# Patient Record
Sex: Male | Born: 1950 | Race: Black or African American | Hispanic: No | Marital: Single | Smoking: Never smoker
Health system: Southern US, Community
[De-identification: ages and names within clinical notes are randomized; demographics above are authoritative.]

## PROBLEM LIST (undated history)

## (undated) ENCOUNTER — Emergency Department (HOSPITAL_COMMUNITY): Admission: EM | Payer: Self-pay | Source: Home / Self Care

## (undated) DIAGNOSIS — H3320 Serous retinal detachment, unspecified eye: Secondary | ICD-10-CM

## (undated) HISTORY — PX: NO PAST SURGERIES: SHX2092

---

## 2016-10-22 DIAGNOSIS — H4313 Vitreous hemorrhage, bilateral: Secondary | ICD-10-CM | POA: Diagnosis not present

## 2016-10-22 DIAGNOSIS — H25813 Combined forms of age-related cataract, bilateral: Secondary | ICD-10-CM | POA: Diagnosis not present

## 2016-10-22 DIAGNOSIS — E113511 Type 2 diabetes mellitus with proliferative diabetic retinopathy with macular edema, right eye: Secondary | ICD-10-CM | POA: Diagnosis not present

## 2016-10-22 DIAGNOSIS — H35372 Puckering of macula, left eye: Secondary | ICD-10-CM | POA: Diagnosis not present

## 2016-10-22 DIAGNOSIS — E113522 Type 2 diabetes mellitus with proliferative diabetic retinopathy with traction retinal detachment involving the macula, left eye: Secondary | ICD-10-CM | POA: Diagnosis not present

## 2016-10-22 DIAGNOSIS — H3562 Retinal hemorrhage, left eye: Secondary | ICD-10-CM | POA: Diagnosis not present

## 2016-11-12 DIAGNOSIS — E113511 Type 2 diabetes mellitus with proliferative diabetic retinopathy with macular edema, right eye: Secondary | ICD-10-CM | POA: Diagnosis not present

## 2016-11-12 DIAGNOSIS — E113522 Type 2 diabetes mellitus with proliferative diabetic retinopathy with traction retinal detachment involving the macula, left eye: Secondary | ICD-10-CM | POA: Diagnosis not present

## 2016-12-15 ENCOUNTER — Encounter (HOSPITAL_COMMUNITY): Payer: Self-pay | Admitting: *Deleted

## 2016-12-15 ENCOUNTER — Ambulatory Visit: Payer: Self-pay | Admitting: Ophthalmology

## 2016-12-15 NOTE — Progress Notes (Signed)
Pt denies SOB, chest pain, and being under the care of a cardiologist. Pt denies having a stress test, echo and cardiac cath. Pt denies having an EKG and chest x ray within the last year. Pt denies having recent labs. Pt made aware to stop taking vitamins, fish oil and herbal medications. Do not take any NSAIDs ie: Ibuprofen, Advil, Naproxen (Aleve), Motrin, BC and Goody Powder or any medication containing Aspirin. Pt verbalized under standing of all pre-op instructions.

## 2016-12-15 NOTE — H&P (Signed)
  Date of examination:  11/12/16  Indication for surgery: PDR and TRD left eye  Pertinent past medical history:  Past Medical History:  Diagnosis Date  . Retinal detachment    left eye    Pertinent ocular history:  Proliferative diabetic retinopathy (PDR)  Pertinent family history: No family history on file.  General:  Healthy appearing patient in no distress.    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

## 2016-12-16 ENCOUNTER — Ambulatory Visit (HOSPITAL_COMMUNITY): Payer: Medicare Other | Admitting: Anesthesiology

## 2016-12-16 ENCOUNTER — Ambulatory Visit (HOSPITAL_COMMUNITY)
Admission: RE | Admit: 2016-12-16 | Discharge: 2016-12-16 | Disposition: A | Discharge status: Home / Self Care | Payer: Medicare Other | Source: Ambulatory Visit | Attending: Ophthalmology | Admitting: Ophthalmology

## 2016-12-16 ENCOUNTER — Encounter (HOSPITAL_COMMUNITY)
Admission: RE | Disposition: A | Discharge status: Home / Self Care | Payer: Self-pay | Source: Ambulatory Visit | Attending: Ophthalmology

## 2016-12-16 ENCOUNTER — Encounter (HOSPITAL_COMMUNITY): Payer: Self-pay | Admitting: *Deleted

## 2016-12-16 DIAGNOSIS — E113591 Type 2 diabetes mellitus with proliferative diabetic retinopathy without macular edema, right eye: Secondary | ICD-10-CM | POA: Diagnosis not present

## 2016-12-16 DIAGNOSIS — E113532 Type 2 diabetes mellitus with proliferative diabetic retinopathy with traction retinal detachment not involving the macula, left eye: Secondary | ICD-10-CM | POA: Diagnosis not present

## 2016-12-16 DIAGNOSIS — H3342 Traction detachment of retina, left eye: Secondary | ICD-10-CM | POA: Diagnosis not present

## 2016-12-16 DIAGNOSIS — E113522 Type 2 diabetes mellitus with proliferative diabetic retinopathy with traction retinal detachment involving the macula, left eye: Secondary | ICD-10-CM | POA: Diagnosis not present

## 2016-12-16 HISTORY — PX: GAS/FLUID EXCHANGE: SHX5334

## 2016-12-16 HISTORY — PX: LASER PHOTO ABLATION: SHX5942

## 2016-12-16 HISTORY — DX: Serous retinal detachment, unspecified eye: H33.20

## 2016-12-16 HISTORY — PX: REPAIR OF COMPLEX TRACTION RETINAL DETACHMENT: SHX6217

## 2016-12-16 LAB — CBC
HEMATOCRIT: 42.1 % (ref 39.0–52.0)
HEMOGLOBIN: 14.6 g/dL (ref 13.0–17.0)
MCH: 28.7 pg (ref 26.0–34.0)
MCHC: 34.7 g/dL (ref 30.0–36.0)
MCV: 82.9 fL (ref 78.0–100.0)
PLATELETS: 264 10*3/uL (ref 150–400)
RBC: 5.08 MIL/uL (ref 4.22–5.81)
RDW: 12.7 % (ref 11.5–15.5)
WBC: 7.8 10*3/uL (ref 4.0–10.5)

## 2016-12-16 LAB — BASIC METABOLIC PANEL
ANION GAP: 14 (ref 5–15)
BUN: 23 mg/dL — AB (ref 6–20)
CALCIUM: 9.7 mg/dL (ref 8.9–10.3)
CO2: 25 mmol/L (ref 22–32)
CREATININE: 1.18 mg/dL (ref 0.61–1.24)
Chloride: 97 mmol/L — ABNORMAL LOW (ref 101–111)
GFR calc Af Amer: >60 mL/min (ref >60)
GLUCOSE: 197 mg/dL — AB (ref 65–99)
Potassium: 3.6 mmol/L (ref 3.5–5.1)
Sodium: 136 mmol/L (ref 135–145)

## 2016-12-16 SURGERY — REPAIR, RETINAL DETACHMENT, COMPLEX
Anesthesia: General | Site: Eye | Laterality: Left

## 2016-12-16 MED ORDER — CEFAZOLIN SUBCONJUNCTIVAL INJECTION 100 MG/0.5 ML
200.0000 mg | INJECTION | SUBCONJUNCTIVAL | Status: DC
  Filled 2016-12-16: qty 5

## 2016-12-16 MED ORDER — ONDANSETRON HCL 4 MG/2ML IJ SOLN
INTRAMUSCULAR | Status: AC
  Filled 2016-12-16: qty 2

## 2016-12-16 MED ORDER — PHENYLEPHRINE HCL 2.5 % OP SOLN
1.0000 [drp] | OPHTHALMIC | Status: AC | PRN
  Administered 2016-12-16 (×3): 1 [drp] via OPHTHALMIC
  Filled 2016-12-16: qty 2

## 2016-12-16 MED ORDER — CEFTAZIDIME INTRAVITREAL INJECTION 2.25 MG/0.1 ML
2.2500 mg | INTRAVITREAL | Status: DC
  Filled 2016-12-16: qty 0.1

## 2016-12-16 MED ORDER — DEXAMETHASONE SODIUM PHOSPHATE 10 MG/ML IJ SOLN
INTRAMUSCULAR | Status: DC | PRN
  Administered 2016-12-16: 10 mg

## 2016-12-16 MED ORDER — LIDOCAINE 2% (20 MG/ML) 5 ML SYRINGE
INTRAMUSCULAR | Status: AC
  Filled 2016-12-16: qty 10

## 2016-12-16 MED ORDER — LIDOCAINE HCL 2 % IJ SOLN
INTRAMUSCULAR | Status: AC
  Filled 2016-12-16: qty 20

## 2016-12-16 MED ORDER — HYPROMELLOSE (GONIOSCOPIC) 2.5 % OP SOLN
OPHTHALMIC | Status: AC
  Filled 2016-12-16: qty 15

## 2016-12-16 MED ORDER — BSS IO SOLN
INTRAOCULAR | Status: AC
  Filled 2016-12-16: qty 15

## 2016-12-16 MED ORDER — TOBRAMYCIN-DEXAMETHASONE 0.3-0.1 % OP OINT
TOPICAL_OINTMENT | OPHTHALMIC | Status: DC | PRN
  Administered 2016-12-16: 1 via OPHTHALMIC

## 2016-12-16 MED ORDER — ATROPINE SULFATE 1 % OP SOLN
OPHTHALMIC | Status: AC
  Filled 2016-12-16: qty 5

## 2016-12-16 MED ORDER — VANCOMYCIN INTRAVITREAL INJECTION 1 MG/0.1 ML
1.0000 mg | INTRAOCULAR | Status: DC
  Filled 2016-12-16: qty 0.1

## 2016-12-16 MED ORDER — SODIUM CHLORIDE 0.9 % IV SOLN
INTRAVENOUS | Status: DC
  Administered 2016-12-16 (×2): via INTRAVENOUS

## 2016-12-16 MED ORDER — TRIAMCINOLONE ACETONIDE 40 MG/ML IJ SUSP
INTRAMUSCULAR | Status: DC | PRN
  Administered 2016-12-16: 40 mg via INTRAMUSCULAR

## 2016-12-16 MED ORDER — ONDANSETRON HCL 4 MG/2ML IJ SOLN
INTRAMUSCULAR | Status: DC | PRN
  Administered 2016-12-16: 4 mg via INTRAVENOUS

## 2016-12-16 MED ORDER — TETRACAINE HCL 1 % IJ SOLN
INTRAMUSCULAR | Status: AC
  Filled 2016-12-16: qty 2

## 2016-12-16 MED ORDER — HYPROMELLOSE (GONIOSCOPIC) 2.5 % OP SOLN
OPHTHALMIC | Status: DC | PRN
  Administered 2016-12-16: 1 [drp]

## 2016-12-16 MED ORDER — TRIAMCINOLONE ACETONIDE 40 MG/ML IJ SUSP
INTRAMUSCULAR | Status: AC
  Filled 2016-12-16: qty 5

## 2016-12-16 MED ORDER — BUPIVACAINE HCL (PF) 0.75 % IJ SOLN
INTRAMUSCULAR | Status: AC
  Filled 2016-12-16: qty 10

## 2016-12-16 MED ORDER — ONDANSETRON HCL 4 MG/2ML IJ SOLN: 4.0000 mg | Freq: Once | INTRAMUSCULAR | Status: DC | PRN

## 2016-12-16 MED ORDER — PROPOFOL 10 MG/ML IV BOLUS
INTRAVENOUS | Status: AC
  Filled 2016-12-16: qty 20

## 2016-12-16 MED ORDER — DEXAMETHASONE SODIUM PHOSPHATE 10 MG/ML IJ SOLN
INTRAMUSCULAR | Status: AC
  Filled 2016-12-16: qty 1

## 2016-12-16 MED ORDER — TETRACAINE HCL 0.5 % OP SOLN
OPHTHALMIC | Status: AC
  Filled 2016-12-16: qty 4

## 2016-12-16 MED ORDER — HYALURONIDASE HUMAN 150 UNIT/ML IJ SOLN
INTRAMUSCULAR | Status: AC
  Filled 2016-12-16: qty 1

## 2016-12-16 MED ORDER — FENTANYL CITRATE (PF) 100 MCG/2ML IJ SOLN
INTRAMUSCULAR | Status: DC | PRN
  Administered 2016-12-16 (×2): 50 ug via INTRAVENOUS

## 2016-12-16 MED ORDER — BSS PLUS IO SOLN
INTRAOCULAR | Status: AC
  Filled 2016-12-16: qty 500

## 2016-12-16 MED ORDER — PROPARACAINE HCL 0.5 % OP SOLN
1.0000 [drp] | OPHTHALMIC | Status: AC | PRN
  Administered 2016-12-16 (×3): 1 [drp] via OPHTHALMIC
  Filled 2016-12-16: qty 15

## 2016-12-16 MED ORDER — PROPOFOL 10 MG/ML IV BOLUS
INTRAVENOUS | Status: DC | PRN
  Administered 2016-12-16: 75 mg via INTRAVENOUS

## 2016-12-16 MED ORDER — LIDOCAINE HCL 2 % IJ SOLN
INTRAMUSCULAR | Status: DC | PRN
  Administered 2016-12-16: 5 mL via RETROBULBAR

## 2016-12-16 MED ORDER — 0.9 % SODIUM CHLORIDE (POUR BTL) OPTIME
TOPICAL | Status: DC | PRN
  Administered 2016-12-16: 200 mL

## 2016-12-16 MED ORDER — FENTANYL CITRATE (PF) 250 MCG/5ML IJ SOLN
INTRAMUSCULAR | Status: AC
  Filled 2016-12-16: qty 5

## 2016-12-16 MED ORDER — SODIUM CHLORIDE 0.9 % IV SOLN
INTRAVENOUS | Status: DC | PRN
  Administered 2016-12-16: 13:00:00

## 2016-12-16 MED ORDER — FENTANYL CITRATE (PF) 100 MCG/2ML IJ SOLN: 25.0000 ug | INTRAMUSCULAR | Status: DC | PRN

## 2016-12-16 MED ORDER — OFLOXACIN 0.3 % OP SOLN
1.0000 [drp] | OPHTHALMIC | Status: AC | PRN
  Administered 2016-12-16 (×3): 1 [drp] via OPHTHALMIC
  Filled 2016-12-16: qty 5

## 2016-12-16 MED ORDER — STERILE WATER FOR IRRIGATION IR SOLN
Status: DC | PRN
  Administered 2016-12-16: 200 mL

## 2016-12-16 MED ORDER — EPINEPHRINE PF 1 MG/ML IJ SOLN
INTRAMUSCULAR | Status: AC
  Filled 2016-12-16: qty 1

## 2016-12-16 MED ORDER — STERILE WATER FOR INJECTION IJ SOLN
INTRAMUSCULAR | Status: AC
  Filled 2016-12-16: qty 10

## 2016-12-16 MED ORDER — CYCLOPENTOLATE HCL 1 % OP SOLN
1.0000 [drp] | OPHTHALMIC | Status: AC | PRN
  Administered 2016-12-16 (×3): 1 [drp] via OPHTHALMIC
  Filled 2016-12-16: qty 2

## 2016-12-16 MED ORDER — MEPERIDINE HCL 25 MG/ML IJ SOLN: 6.2500 mg | INTRAMUSCULAR | Status: DC | PRN

## 2016-12-16 MED ORDER — EPINEPHRINE PF 1 MG/ML IJ SOLN
INTRAOCULAR | Status: DC | PRN
  Administered 2016-12-16: 12:00:00

## 2016-12-16 MED ORDER — TOBRAMYCIN-DEXAMETHASONE 0.3-0.1 % OP OINT
TOPICAL_OINTMENT | OPHTHALMIC | Status: AC
  Filled 2016-12-16: qty 3.5

## 2016-12-16 MED ORDER — PHENYLEPHRINE HCL 10 % OP SOLN
OPHTHALMIC | Status: DC | PRN
  Administered 2016-12-16: 1 [drp] via OPHTHALMIC

## 2016-12-16 MED ORDER — SUGAMMADEX SODIUM 200 MG/2ML IV SOLN
INTRAVENOUS | Status: AC
  Filled 2016-12-16: qty 2

## 2016-12-16 MED ORDER — PHENYLEPHRINE HCL 10 % OP SOLN
1.0000 [drp] | OPHTHALMIC | Status: DC
  Filled 2016-12-16: qty 5

## 2016-12-16 MED ORDER — TROPICAMIDE 1 % OP SOLN
1.0000 [drp] | OPHTHALMIC | Status: AC | PRN
  Administered 2016-12-16 (×3): 1 [drp] via OPHTHALMIC
  Filled 2016-12-16: qty 15

## 2016-12-16 SURGICAL SUPPLY — 77 items
APPLICATOR COTTON TIP 6IN STRL (MISCELLANEOUS) ×4 IMPLANT
APPLICATOR DR MATTHEWS STRL (MISCELLANEOUS) ×4 IMPLANT
BLADE MINI 60D BLUE (BLADE) IMPLANT
BLADE MINI RND TIP GREEN BEAV (BLADE) IMPLANT
BLADE MVR KNIFE 20G (BLADE) IMPLANT
CANNULA ANT CHAM MAIN (OPHTHALMIC RELATED) IMPLANT
CANNULA DUAL BORE 23G (CANNULA) IMPLANT
CANNULA DUALBORE 25G (CANNULA) IMPLANT
CANNULA VLV SOFT TIP 25GA (OPHTHALMIC) ×4 IMPLANT
CAUTERY EYE LOW TEMP 1300F FIN (OPHTHALMIC RELATED) IMPLANT
CLOSURE STERI-STRIP 1/2X4 (GAUZE/BANDAGES/DRESSINGS) ×1
CLSR STERI-STRIP ANTIMIC 1/2X4 (GAUZE/BANDAGES/DRESSINGS) ×3 IMPLANT
CORD BIPOLAR FORCEPS 12FT (ELECTRODE) ×4 IMPLANT
CORDS BIPOLAR (ELECTRODE) ×4 IMPLANT
COVER MAYO STAND STRL (DRAPES) IMPLANT
COVER SURGICAL LIGHT HANDLE (MISCELLANEOUS) IMPLANT
DRAPE HALF SHEET 40X57 (DRAPES) ×4 IMPLANT
DRAPE INCISE 51X51 W/FILM STRL (DRAPES) IMPLANT
DRAPE RETRACTOR (MISCELLANEOUS) ×4 IMPLANT
ERASER HMR WETFIELD 23G BP (MISCELLANEOUS) IMPLANT
FILTER BLUE MILLIPORE (MISCELLANEOUS) IMPLANT
FILTER STRAW FLUID ASPIR (MISCELLANEOUS) IMPLANT
FORCEPS ECKARDT ILM 25G SERR (OPHTHALMIC RELATED) IMPLANT
FORCEPS GRIESHABER ILM 25G A (INSTRUMENTS) ×4 IMPLANT
GAS AUTO FILL CONSTEL (OPHTHALMIC) ×4
GAS AUTO FILL CONSTELLATION (OPHTHALMIC) ×2 IMPLANT
GAS IO SF6 125GM ISPAN CNSTL (MEDICAL GASES) ×2 IMPLANT
GAS OPHTHALMIC (MISCELLANEOUS) IMPLANT
GAS TANK CONSTELL (MEDICAL GASES) ×2
GLOVE ECLIPSE 7.5 STRL STRAW (GLOVE) ×8 IMPLANT
GLOVE SURG SS PI 6.5 STRL IVOR (GLOVE) ×8 IMPLANT
GOWN STRL REUS W/ TWL LRG LVL3 (GOWN DISPOSABLE) ×6 IMPLANT
GOWN STRL REUS W/TWL LRG LVL3 (GOWN DISPOSABLE) ×6
HANDLE PNEUMATIC FOR CONSTEL (OPHTHALMIC) IMPLANT
KIT BASIN OR (CUSTOM PROCEDURE TRAY) ×4 IMPLANT
KIT PERFLUORON PROCEDURE 5ML (MISCELLANEOUS) IMPLANT
KIT ROOM TURNOVER OR (KITS) ×4 IMPLANT
LENS BIOM SUPER VIEW SET DISP (OPHTHALMIC RELATED) ×4 IMPLANT
MICROPICK 25G (MISCELLANEOUS)
NEEDLE 25GX 5/8IN NON SAFETY (NEEDLE) ×4 IMPLANT
NEEDLE FILTER BLUNT 18X 1/2SAF (NEEDLE) ×6
NEEDLE FILTER BLUNT 18X1 1/2 (NEEDLE) ×6 IMPLANT
NEEDLE HYPO 25GX1X1/2 BEV (NEEDLE) ×4 IMPLANT
NEEDLE HYPO 30X.5 LL (NEEDLE) ×16 IMPLANT
NEEDLE RETROBULBAR 25GX1.5 (NEEDLE) IMPLANT
NS IRRIG 1000ML POUR BTL (IV SOLUTION) ×4 IMPLANT
PACK VITRECTOMY CUSTOM (CUSTOM PROCEDURE TRAY) ×4 IMPLANT
PAD ARMBOARD 7.5X6 YLW CONV (MISCELLANEOUS) ×8 IMPLANT
PAK PIK VITRECTOMY CVS 25GA (OPHTHALMIC) ×4 IMPLANT
PAK VITRECTOMY PIK 25 GA (OPHTHALMIC RELATED) IMPLANT
PENCIL BIPOLAR 25GA STR DISP (OPHTHALMIC RELATED) IMPLANT
PICK MICROPICK 25G (MISCELLANEOUS) IMPLANT
PROBE LASER ILLUM FLEX CVD 25G (OPHTHALMIC) ×4 IMPLANT
REPL STRA BRUSH NEEDLE (NEEDLE) IMPLANT
RESERVOIR BACK FLUSH (MISCELLANEOUS) IMPLANT
RETRACTOR IRIS FLEX 25G GRIESH (INSTRUMENTS) IMPLANT
ROLLS DENTAL (MISCELLANEOUS) IMPLANT
SCISSORS TIP ADVANCED DSP 25GA (INSTRUMENTS) ×4 IMPLANT
SCRAPER DIAMOND 25GA (OPHTHALMIC RELATED) ×4 IMPLANT
SET FLUID INJECTOR (SET/KITS/TRAYS/PACK) IMPLANT
SHEET MEDIUM DRAPE 40X70 STRL (DRAPES) ×4 IMPLANT
SOLUTION ANTI FOG 6CC (MISCELLANEOUS) ×4 IMPLANT
STOCKINETTE IMPERVIOUS 9X36 MD (GAUZE/BANDAGES/DRESSINGS) ×4 IMPLANT
STOPCOCK 4 WAY LG BORE MALE ST (IV SETS) IMPLANT
SUT ETHILON 5.0 S-24 (SUTURE) ×4 IMPLANT
SUT SILK 2 0 (SUTURE) ×2
SUT SILK 2-0 18XBRD TIE 12 (SUTURE) ×2 IMPLANT
SUT VICRYL 7 0 TG140 8 (SUTURE) ×4 IMPLANT
SYR 10ML LL (SYRINGE) IMPLANT
SYR 20CC LL (SYRINGE) ×4 IMPLANT
SYR 3ML LL SCALE MARK (SYRINGE) ×4 IMPLANT
SYR 5ML LL (SYRINGE) ×4 IMPLANT
SYR TB 1ML LUER SLIP (SYRINGE) ×12 IMPLANT
TOWEL OR 17X24 6PK STRL BLUE (TOWEL DISPOSABLE) ×8 IMPLANT
TOWEL OR 17X26 4PK STRL BLUE (TOWEL DISPOSABLE) ×4 IMPLANT
WATER STERILE IRR 1000ML POUR (IV SOLUTION) ×4 IMPLANT
WIPE INSTRUMENT VISIWIPE 73X73 (MISCELLANEOUS) IMPLANT

## 2016-12-16 NOTE — Op Note (Signed)
Francisco Chang 12/16/2016 Diagnosis: Tractional retinal detachment left eye  Procedure: Pars Plana Vitrectomy, Membrane Peeling, Endolaser, Fluid Gas Exchange and membranectomy Operative Eye:  left eye  Surgeon: Harrold DonathNarendra Mafabhai James Senn Estimated Blood Loss: minimal Specimens for Pathology:  None Complications: none    The  patient was prepped and draped in the usual fashion for ocular surgery on the  left eye .  A lid speculum was placed.  Infusion line and trocar was placed at the 4 o'clock position approximately 3.5 mm from the surgical limbus.   The infusion line was allowed to run and then clamped when placed at the cannula opening. The line was inserted and secured to the drape with an adhesive strip.   Active trocars/cannula were placed at the 10 and 2 o'clock positions approximately 3.5 mm from the surgical limbus. The cannula was visualized in the vitreous cavity.  The light pipe and vitreous cutter were inserted into the vitreous cavity and a core vitrectomy was performed.  Care taken to remove the vitreous up to the vitreous base for 360 degrees. There was a tractional band through the fovea in the left eye.  The neovascular fronds extended to the arcades from the foveal fold.  Additional neovascular fronds were noted inferiorly.  Careful segementation and delamination was performed to separate the attachment of the posterior hyaloid.  Membrane forceps were used to elevate the inferotemporal neovascular membrane and this membrane was removed.  Additional membranes were removed over the macula and overlying the disc.  There was a break noted in the macula from the tractional detachment.  The gliotic membrane surrounding this break was removed with the vitrector.  The peripheral vitrectomy was completed after the posterior hylaoid was elevated.  Endolaser was applied 360 degrees to treat the areas of ischemic retina.  Endocautery was used to attain hemostasis.  A complete air fluid exchange  was performed and 18% SF6 was then placed in the eye to achieve a near complete fill.  The trocars were sequentially removed and noted to be airtight.  Normal intraocular pressure was noted by digital palpapation.  Subconjunctival injection of Ancef and Decadron were placed.   The speculum and drapes were removed and the eye was patched with Polymixin/Bacitracin ophthalmic ointment. An eye shield was placed and the patient was transferred alert and conversant with stable vital signs to the post operative recovery area.  The patient tolerated the procedure well and no complications were noted.  Harrold DonathNarendra Mafabhai Francisco Bacchi MD

## 2016-12-16 NOTE — Transfer of Care (Signed)
Immediate Anesthesia Transfer of Care Note  Patient: Francisco Chang  Procedure(s) Performed: Procedure(s): REPAIR OF COMPLEX TRACTION RETINAL DETACHMENT LEFT EYE Membranectomy (Left) LASER PHOTO ABLATION (Left) GAS/FLUID EXCHANGE (Left)  Patient Location: PACU  Anesthesia Type:MAC  Level of Consciousness: awake, alert , oriented and patient cooperative  Airway & Oxygen Therapy: Patient Spontanous Breathing  Post-op Assessment: Report given to RN, Post -op Vital signs reviewed and stable and Patient moving all extremities  Post vital signs: Reviewed and stable  Last Vitals:  Vitals:   12/16/16 0916 12/16/16 0953  BP: (!) 183/98 (!) 185/96  Pulse:  88  Resp:    Temp:    SpO2:      Last Pain:  Vitals:   12/16/16 0901  TempSrc: Oral      Patients Stated Pain Goal: 3 (73/56/70 1410)  Complications: No apparent anesthesia complications

## 2016-12-16 NOTE — Anesthesia Postprocedure Evaluation (Signed)
Anesthesia Post Note  Patient: Ayaz Holloran  Procedure(s) Performed: Procedure(s) (LRB): REPAIR OF COMPLEX TRACTION RETINAL DETACHMENT LEFT EYE Membranectomy (Left) LASER PHOTO ABLATION (Left) GAS/FLUID EXCHANGE (Left)     Patient location during evaluation: PACU Anesthesia Type: MAC Level of consciousness: awake and alert Pain management: pain level controlled Vital Signs Assessment: post-procedure vital signs reviewed and stable Respiratory status: spontaneous breathing, nonlabored ventilation, respiratory function stable and patient connected to nasal cannula oxygen Cardiovascular status: stable and blood pressure returned to baseline Anesthetic complications: no    Last Vitals:  Vitals:   12/16/16 1449 12/16/16 1450  BP:    Pulse: 84 80  Resp:    Temp:    SpO2: 100% 100%    Last Pain:  Vitals:   12/16/16 1450  TempSrc:   PainSc: 0-No pain                 Viktor Philipp

## 2016-12-16 NOTE — H&P (Signed)
Previously entered and copied below:  H&P Encounter Date: 12/15/2016 Carmela RimaPatel, Josejulian Tarango, MD  Ophthalmology    [] Hide copied text [] Hover for attribution information  Date of examination:  11/12/16  Indication for surgery: PDR and TRD left eye  Pertinent past medical history:      Past Medical History:  Diagnosis Date  . Retinal detachment    left eye    Pertinent ocular history:  Proliferative diabetic retinopathy (PDR)  Pertinent family history: No family history on file.  General:  Healthy appearing patient in no distress.    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

## 2016-12-16 NOTE — Anesthesia Procedure Notes (Signed)
Procedure Name: MAC Date/Time: 12/16/2016 12:10 PM Performed by: Izora Gala Pre-anesthesia Checklist: Patient identified, Emergency Drugs available, Suction available and Patient being monitored Patient Re-evaluated:Patient Re-evaluated prior to induction Oxygen Delivery Method: Nasal cannula Induction Type: IV induction Placement Confirmation: positive ETCO2

## 2016-12-16 NOTE — Brief Op Note (Signed)
12/16/2016  2:31 PM  PATIENT:  Francisco Chang  66 y.o. male  PRE-OPERATIVE DIAGNOSIS:  TRACTIONAL DETACHMENT OF RETINA LEFT EYE  POST-OPERATIVE DIAGNOSIS:  TRACTIONAL DETACHMENT OF RETINA LEFT EYE  PROCEDURE:  Procedure(s): REPAIR OF COMPLEX TRACTION RETINAL DETACHMENT LEFT EYE Membranectomy (Left) LASER PHOTO ABLATION (Left) GAS/FLUID EXCHANGE (Left)  SURGEON:  Surgeon(s) and Role:    * Jalene Mullet, MD - Primary  PHYSICIAN ASSISTANT:   ASSISTANTS: none   ANESTHESIA:   local and MAC  EBL:  Total I/O In: 600 [I.V.:600] Out: 0   BLOOD ADMINISTERED:none  DRAINS: none   LOCAL MEDICATIONS USED:  MARCAINE    and LIDOCAINE   SPECIMEN:  No Specimen  DISPOSITION OF SPECIMEN:  N/A  COUNTS:  YES  TOURNIQUET:  * No tourniquets in log *  DICTATION: .Note written in EPIC  PLAN OF CARE: Discharge to home after PACU  PATIENT DISPOSITION:  PACU - hemodynamically stable.   Delay start of Pharmacological VTE agent (>24hrs) due to surgical blood loss or risk of bleeding: not applicable

## 2016-12-16 NOTE — Discharge Instructions (Signed)
DO NOT SLEEP ON BACK, THE EYE PRESSURE CAN GO UP AND CAUSE VISION LOSS   SLEEP ON SIDE WITH NOSE TO PILLOW  DURING DAY KEEP FACE DOWN.  15 MINUTES EVERY 2 HOURS IT IS OK TO LOOK STRAIGHT AHEAD (USE BATHROOM, EAT, WALK, ETC.)  

## 2016-12-16 NOTE — OR Nursing (Signed)
Gas Bubble in eye green bracelet place on patients left arm with Dr. Eliane DecreePatel's information

## 2016-12-16 NOTE — Anesthesia Preprocedure Evaluation (Addendum)
Anesthesia Evaluation  Patient identified by MRN, date of birth, ID band Patient awake  General Assessment Comment:Noncompliant   c care  Reviewed: Allergy & Precautions, NPO status , Patient's Chart, lab work & pertinent test results  Airway Mallampati: I   Neck ROM: Full    Dental  (+) Edentulous Upper   Pulmonary neg pulmonary ROS,    breath sounds clear to auscultation       Cardiovascular negative cardio ROS   Rhythm:Regular Rate:Normal     Neuro/Psych negative neurological ROS  negative psych ROS   GI/Hepatic negative GI ROS, Neg liver ROS,   Endo/Other  negative endocrine ROSdiabetes, Poorly Controlled  Renal/GU negative Renal ROS  negative genitourinary   Musculoskeletal negative musculoskeletal ROS (+)   Abdominal   Peds negative pediatric ROS (+)  Hematology negative hematology ROS (+)   Anesthesia Other Findings   Reproductive/Obstetrics negative OB ROS                                                             Anesthesia Evaluation  Patient identified by MRN, date of birth, ID bandGeneral Assessment Comment:Noncompliant   c care  Airway Mallampati: I   Neck ROM: Full    Dental  (+) Edentulous Upper   Pulmonary neg pulmonary ROS,    breath sounds clear to auscultation       Cardiovascular  Rhythm:Regular Rate:Normal     Neuro/Psych negative neurological ROS     GI/Hepatic   Endo/Other  diabetes, Poorly Controlled  Renal/GU      Musculoskeletal   Abdominal   Peds  Hematology   Anesthesia Other Findings   Reproductive/Obstetrics                             Anesthesia Physical Anesthesia Plan  ASA: III  Anesthesia Plan: General   Post-op Pain Management:    Induction: Intravenous  PONV Risk Score and Plan: 3 and Ondansetron, Dexamethasone, Midazolam, Propofol infusion and Treatment may vary due to age or  medical condition  Airway Management Planned: Oral ETT  Additional Equipment:   Intra-op Plan:   Post-operative Plan: Extubation in OR  Informed Consent: I have reviewed the patients History and Physical, chart, labs and discussed the procedure including the risks, benefits and alternatives for the proposed anesthesia with the patient or authorized representative who has indicated his/her understanding and acceptance.   Dental advisory given  Plan Discussed with: CRNA  Anesthesia Plan Comments:         Anesthesia Quick Evaluation  Anesthesia Physical Anesthesia Plan  ASA: III  Anesthesia Plan: MAC   Post-op Pain Management:    Induction: Intravenous  PONV Risk Score and Plan: 3 and Ondansetron, Dexamethasone, Midazolam, Propofol infusion and Treatment may vary due to age or medical condition  Airway Management Planned: Oral ETT  Additional Equipment:   Intra-op Plan:   Post-operative Plan: Extubation in OR  Informed Consent: I have reviewed the patients History and Physical, chart, labs and discussed the procedure including the risks, benefits and alternatives for the proposed anesthesia with the patient or authorized representative who has indicated his/her understanding and acceptance.   Dental advisory given  Plan Discussed with: CRNA  Anesthesia Plan Comments:  Anesthesia Quick Evaluation  

## 2016-12-17 ENCOUNTER — Encounter (HOSPITAL_COMMUNITY): Payer: Self-pay | Admitting: Ophthalmology

## 2016-12-24 DIAGNOSIS — E113511 Type 2 diabetes mellitus with proliferative diabetic retinopathy with macular edema, right eye: Secondary | ICD-10-CM | POA: Diagnosis not present

## 2017-01-07 DIAGNOSIS — E113511 Type 2 diabetes mellitus with proliferative diabetic retinopathy with macular edema, right eye: Secondary | ICD-10-CM | POA: Diagnosis not present

## 2017-01-17 DIAGNOSIS — H2511 Age-related nuclear cataract, right eye: Secondary | ICD-10-CM | POA: Diagnosis not present

## 2017-01-17 DIAGNOSIS — H25812 Combined forms of age-related cataract, left eye: Secondary | ICD-10-CM | POA: Diagnosis not present

## 2017-01-17 DIAGNOSIS — E113593 Type 2 diabetes mellitus with proliferative diabetic retinopathy without macular edema, bilateral: Secondary | ICD-10-CM | POA: Diagnosis not present

## 2017-01-19 DIAGNOSIS — H25812 Combined forms of age-related cataract, left eye: Secondary | ICD-10-CM | POA: Diagnosis not present

## 2017-01-19 DIAGNOSIS — H2589 Other age-related cataract: Secondary | ICD-10-CM | POA: Diagnosis not present

## 2017-01-19 DIAGNOSIS — H2512 Age-related nuclear cataract, left eye: Secondary | ICD-10-CM | POA: Diagnosis not present

## 2017-03-10 DIAGNOSIS — E113511 Type 2 diabetes mellitus with proliferative diabetic retinopathy with macular edema, right eye: Secondary | ICD-10-CM | POA: Diagnosis not present

## 2017-03-10 DIAGNOSIS — H3562 Retinal hemorrhage, left eye: Secondary | ICD-10-CM | POA: Diagnosis not present

## 2017-03-10 DIAGNOSIS — Z961 Presence of intraocular lens: Secondary | ICD-10-CM | POA: Diagnosis not present

## 2017-03-10 DIAGNOSIS — E113522 Type 2 diabetes mellitus with proliferative diabetic retinopathy with traction retinal detachment involving the macula, left eye: Secondary | ICD-10-CM | POA: Diagnosis not present

## 2017-03-10 DIAGNOSIS — H35372 Puckering of macula, left eye: Secondary | ICD-10-CM | POA: Diagnosis not present

## 2017-03-10 DIAGNOSIS — H25811 Combined forms of age-related cataract, right eye: Secondary | ICD-10-CM | POA: Diagnosis not present

## 2017-03-10 DIAGNOSIS — E113521 Type 2 diabetes mellitus with proliferative diabetic retinopathy with traction retinal detachment involving the macula, right eye: Secondary | ICD-10-CM | POA: Diagnosis not present

## 2017-03-11 DIAGNOSIS — E113511 Type 2 diabetes mellitus with proliferative diabetic retinopathy with macular edema, right eye: Secondary | ICD-10-CM | POA: Diagnosis not present

## 2017-03-15 DIAGNOSIS — H3341 Traction detachment of retina, right eye: Secondary | ICD-10-CM | POA: Diagnosis not present

## 2017-03-15 DIAGNOSIS — E113521 Type 2 diabetes mellitus with proliferative diabetic retinopathy with traction retinal detachment involving the macula, right eye: Secondary | ICD-10-CM | POA: Diagnosis not present

## 2017-03-16 DIAGNOSIS — E113521 Type 2 diabetes mellitus with proliferative diabetic retinopathy with traction retinal detachment involving the macula, right eye: Secondary | ICD-10-CM | POA: Diagnosis not present

## 2017-03-24 DIAGNOSIS — E113511 Type 2 diabetes mellitus with proliferative diabetic retinopathy with macular edema, right eye: Secondary | ICD-10-CM | POA: Diagnosis not present

## 2017-03-24 DIAGNOSIS — E113521 Type 2 diabetes mellitus with proliferative diabetic retinopathy with traction retinal detachment involving the macula, right eye: Secondary | ICD-10-CM | POA: Diagnosis not present

## 2017-03-29 DIAGNOSIS — E113512 Type 2 diabetes mellitus with proliferative diabetic retinopathy with macular edema, left eye: Secondary | ICD-10-CM | POA: Diagnosis not present

## 2017-04-26 DIAGNOSIS — E119 Type 2 diabetes mellitus without complications: Secondary | ICD-10-CM | POA: Diagnosis not present

## 2017-04-26 DIAGNOSIS — I10 Essential (primary) hypertension: Secondary | ICD-10-CM | POA: Diagnosis not present

## 2017-04-26 DIAGNOSIS — Z23 Encounter for immunization: Secondary | ICD-10-CM | POA: Diagnosis not present

## 2017-04-26 DIAGNOSIS — Z681 Body mass index (BMI) 19 or less, adult: Secondary | ICD-10-CM | POA: Diagnosis not present

## 2017-04-26 DIAGNOSIS — Z7689 Persons encountering health services in other specified circumstances: Secondary | ICD-10-CM | POA: Diagnosis not present

## 2017-04-26 DIAGNOSIS — R636 Underweight: Secondary | ICD-10-CM | POA: Diagnosis not present

## 2017-05-04 DIAGNOSIS — E113511 Type 2 diabetes mellitus with proliferative diabetic retinopathy with macular edema, right eye: Secondary | ICD-10-CM | POA: Diagnosis not present

## 2017-05-09 DIAGNOSIS — E113512 Type 2 diabetes mellitus with proliferative diabetic retinopathy with macular edema, left eye: Secondary | ICD-10-CM | POA: Diagnosis not present

## 2017-05-23 DIAGNOSIS — H2511 Age-related nuclear cataract, right eye: Secondary | ICD-10-CM | POA: Diagnosis not present

## 2017-05-25 DIAGNOSIS — H2511 Age-related nuclear cataract, right eye: Secondary | ICD-10-CM | POA: Diagnosis not present

## 2017-05-25 DIAGNOSIS — H25811 Combined forms of age-related cataract, right eye: Secondary | ICD-10-CM | POA: Diagnosis not present

## 2017-05-27 DIAGNOSIS — Z1389 Encounter for screening for other disorder: Secondary | ICD-10-CM | POA: Diagnosis not present

## 2017-05-27 DIAGNOSIS — I10 Essential (primary) hypertension: Secondary | ICD-10-CM | POA: Diagnosis not present

## 2017-05-27 DIAGNOSIS — Z1331 Encounter for screening for depression: Secondary | ICD-10-CM | POA: Diagnosis not present

## 2017-05-27 DIAGNOSIS — R636 Underweight: Secondary | ICD-10-CM | POA: Diagnosis not present

## 2017-05-27 DIAGNOSIS — E119 Type 2 diabetes mellitus without complications: Secondary | ICD-10-CM | POA: Diagnosis not present

## 2017-05-27 DIAGNOSIS — G4700 Insomnia, unspecified: Secondary | ICD-10-CM | POA: Diagnosis not present

## 2017-06-08 DIAGNOSIS — E113511 Type 2 diabetes mellitus with proliferative diabetic retinopathy with macular edema, right eye: Secondary | ICD-10-CM | POA: Diagnosis not present

## 2017-06-13 DIAGNOSIS — E113512 Type 2 diabetes mellitus with proliferative diabetic retinopathy with macular edema, left eye: Secondary | ICD-10-CM | POA: Diagnosis not present

## 2017-07-13 DIAGNOSIS — E113511 Type 2 diabetes mellitus with proliferative diabetic retinopathy with macular edema, right eye: Secondary | ICD-10-CM | POA: Diagnosis not present

## 2017-07-15 DIAGNOSIS — E113512 Type 2 diabetes mellitus with proliferative diabetic retinopathy with macular edema, left eye: Secondary | ICD-10-CM | POA: Diagnosis not present

## 2017-07-15 DIAGNOSIS — H35371 Puckering of macula, right eye: Secondary | ICD-10-CM | POA: Diagnosis not present

## 2017-07-19 DIAGNOSIS — H3582 Retinal ischemia: Secondary | ICD-10-CM | POA: Diagnosis not present

## 2017-07-19 DIAGNOSIS — H35372 Puckering of macula, left eye: Secondary | ICD-10-CM | POA: Diagnosis not present

## 2017-07-19 DIAGNOSIS — H35371 Puckering of macula, right eye: Secondary | ICD-10-CM | POA: Diagnosis not present

## 2017-07-19 DIAGNOSIS — E113512 Type 2 diabetes mellitus with proliferative diabetic retinopathy with macular edema, left eye: Secondary | ICD-10-CM | POA: Diagnosis not present

## 2017-07-19 DIAGNOSIS — E113511 Type 2 diabetes mellitus with proliferative diabetic retinopathy with macular edema, right eye: Secondary | ICD-10-CM | POA: Diagnosis not present

## 2017-07-26 DIAGNOSIS — H35371 Puckering of macula, right eye: Secondary | ICD-10-CM | POA: Diagnosis not present

## 2017-08-17 DIAGNOSIS — E113511 Type 2 diabetes mellitus with proliferative diabetic retinopathy with macular edema, right eye: Secondary | ICD-10-CM | POA: Diagnosis not present

## 2017-08-23 DIAGNOSIS — H35372 Puckering of macula, left eye: Secondary | ICD-10-CM | POA: Diagnosis not present

## 2017-08-25 DIAGNOSIS — I10 Essential (primary) hypertension: Secondary | ICD-10-CM | POA: Diagnosis not present

## 2017-08-25 DIAGNOSIS — G4700 Insomnia, unspecified: Secondary | ICD-10-CM | POA: Diagnosis not present

## 2017-08-25 DIAGNOSIS — E119 Type 2 diabetes mellitus without complications: Secondary | ICD-10-CM | POA: Diagnosis not present

## 2017-08-25 DIAGNOSIS — Z9181 History of falling: Secondary | ICD-10-CM | POA: Diagnosis not present

## 2017-08-27 DIAGNOSIS — H53131 Sudden visual loss, right eye: Secondary | ICD-10-CM | POA: Diagnosis not present

## 2017-08-27 DIAGNOSIS — H3582 Retinal ischemia: Secondary | ICD-10-CM | POA: Diagnosis not present

## 2017-08-27 DIAGNOSIS — H35372 Puckering of macula, left eye: Secondary | ICD-10-CM | POA: Diagnosis not present

## 2017-08-27 DIAGNOSIS — E113511 Type 2 diabetes mellitus with proliferative diabetic retinopathy with macular edema, right eye: Secondary | ICD-10-CM | POA: Diagnosis not present

## 2017-08-27 DIAGNOSIS — Z961 Presence of intraocular lens: Secondary | ICD-10-CM | POA: Diagnosis not present

## 2017-08-27 DIAGNOSIS — H35371 Puckering of macula, right eye: Secondary | ICD-10-CM | POA: Diagnosis not present

## 2017-08-27 DIAGNOSIS — E113512 Type 2 diabetes mellitus with proliferative diabetic retinopathy with macular edema, left eye: Secondary | ICD-10-CM | POA: Diagnosis not present

## 2017-08-31 DIAGNOSIS — H3582 Retinal ischemia: Secondary | ICD-10-CM | POA: Diagnosis not present

## 2017-08-31 DIAGNOSIS — E113512 Type 2 diabetes mellitus with proliferative diabetic retinopathy with macular edema, left eye: Secondary | ICD-10-CM | POA: Diagnosis not present

## 2017-08-31 DIAGNOSIS — E113511 Type 2 diabetes mellitus with proliferative diabetic retinopathy with macular edema, right eye: Secondary | ICD-10-CM | POA: Diagnosis not present

## 2017-09-19 DIAGNOSIS — E113512 Type 2 diabetes mellitus with proliferative diabetic retinopathy with macular edema, left eye: Secondary | ICD-10-CM | POA: Diagnosis not present

## 2017-10-03 DIAGNOSIS — E113511 Type 2 diabetes mellitus with proliferative diabetic retinopathy with macular edema, right eye: Secondary | ICD-10-CM | POA: Diagnosis not present

## 2017-10-05 DIAGNOSIS — E113512 Type 2 diabetes mellitus with proliferative diabetic retinopathy with macular edema, left eye: Secondary | ICD-10-CM | POA: Diagnosis not present

## 2017-10-31 DIAGNOSIS — E113511 Type 2 diabetes mellitus with proliferative diabetic retinopathy with macular edema, right eye: Secondary | ICD-10-CM | POA: Diagnosis not present

## 2017-11-14 DIAGNOSIS — E113512 Type 2 diabetes mellitus with proliferative diabetic retinopathy with macular edema, left eye: Secondary | ICD-10-CM | POA: Diagnosis not present

## 2017-11-28 DIAGNOSIS — E559 Vitamin D deficiency, unspecified: Secondary | ICD-10-CM | POA: Diagnosis not present

## 2017-11-28 DIAGNOSIS — E119 Type 2 diabetes mellitus without complications: Secondary | ICD-10-CM | POA: Diagnosis not present

## 2017-11-28 DIAGNOSIS — I10 Essential (primary) hypertension: Secondary | ICD-10-CM | POA: Diagnosis not present

## 2017-11-28 DIAGNOSIS — G4700 Insomnia, unspecified: Secondary | ICD-10-CM | POA: Diagnosis not present

## 2017-12-05 DIAGNOSIS — E113511 Type 2 diabetes mellitus with proliferative diabetic retinopathy with macular edema, right eye: Secondary | ICD-10-CM | POA: Diagnosis not present

## 2017-12-15 DIAGNOSIS — E113553 Type 2 diabetes mellitus with stable proliferative diabetic retinopathy, bilateral: Secondary | ICD-10-CM | POA: Diagnosis not present

## 2017-12-15 DIAGNOSIS — Z961 Presence of intraocular lens: Secondary | ICD-10-CM | POA: Diagnosis not present

## 2017-12-19 ENCOUNTER — Other Ambulatory Visit: Payer: Self-pay

## 2017-12-19 ENCOUNTER — Emergency Department (HOSPITAL_COMMUNITY)
Admission: EM | Admit: 2017-12-19 | Discharge: 2017-12-20 | Disposition: A | Discharge status: Home / Self Care | Payer: Medicare Other | Attending: Emergency Medicine | Admitting: Emergency Medicine

## 2017-12-19 ENCOUNTER — Emergency Department (HOSPITAL_COMMUNITY): Payer: Medicare Other

## 2017-12-19 ENCOUNTER — Encounter (HOSPITAL_COMMUNITY): Payer: Self-pay | Admitting: *Deleted

## 2017-12-19 DIAGNOSIS — I639 Cerebral infarction, unspecified: Secondary | ICD-10-CM | POA: Diagnosis not present

## 2017-12-19 DIAGNOSIS — H3582 Retinal ischemia: Secondary | ICD-10-CM | POA: Diagnosis not present

## 2017-12-19 DIAGNOSIS — Z79899 Other long term (current) drug therapy: Secondary | ICD-10-CM | POA: Diagnosis not present

## 2017-12-19 DIAGNOSIS — H547 Unspecified visual loss: Secondary | ICD-10-CM

## 2017-12-19 DIAGNOSIS — H5461 Unqualified visual loss, right eye, normal vision left eye: Secondary | ICD-10-CM | POA: Diagnosis not present

## 2017-12-19 DIAGNOSIS — H35371 Puckering of macula, right eye: Secondary | ICD-10-CM | POA: Diagnosis not present

## 2017-12-19 DIAGNOSIS — E119 Type 2 diabetes mellitus without complications: Secondary | ICD-10-CM | POA: Insufficient documentation

## 2017-12-19 DIAGNOSIS — E113511 Type 2 diabetes mellitus with proliferative diabetic retinopathy with macular edema, right eye: Secondary | ICD-10-CM | POA: Diagnosis not present

## 2017-12-19 DIAGNOSIS — H53131 Sudden visual loss, right eye: Secondary | ICD-10-CM | POA: Diagnosis not present

## 2017-12-19 DIAGNOSIS — E113522 Type 2 diabetes mellitus with proliferative diabetic retinopathy with traction retinal detachment involving the macula, left eye: Secondary | ICD-10-CM | POA: Diagnosis not present

## 2017-12-19 DIAGNOSIS — H35372 Puckering of macula, left eye: Secondary | ICD-10-CM | POA: Diagnosis not present

## 2017-12-19 LAB — PROTIME-INR
INR: 0.96
Prothrombin Time: 12.7 seconds (ref 11.4–15.2)

## 2017-12-19 LAB — CBC
HCT: 36 % — ABNORMAL LOW (ref 39.0–52.0)
Hemoglobin: 11.6 g/dL — ABNORMAL LOW (ref 13.0–17.0)
MCH: 28.9 pg (ref 26.0–34.0)
MCHC: 32.2 g/dL (ref 30.0–36.0)
MCV: 89.6 fL (ref 78.0–100.0)
Platelets: 299 10*3/uL (ref 150–400)
RBC: 4.02 MIL/uL — AB (ref 4.22–5.81)
RDW: 12.4 % (ref 11.5–15.5)
WBC: 6.4 10*3/uL (ref 4.0–10.5)

## 2017-12-19 LAB — COMPREHENSIVE METABOLIC PANEL
ALT: 13 U/L (ref 0–44)
AST: 13 U/L — AB (ref 15–41)
Albumin: 4 g/dL (ref 3.5–5.0)
Alkaline Phosphatase: 71 U/L (ref 38–126)
Anion gap: 9 (ref 5–15)
BUN: 24 mg/dL — AB (ref 8–23)
CHLORIDE: 106 mmol/L (ref 98–111)
CO2: 26 mmol/L (ref 22–32)
CREATININE: 1.16 mg/dL (ref 0.61–1.24)
Calcium: 9.3 mg/dL (ref 8.9–10.3)
GFR calc Af Amer: >60 mL/min (ref >60)
GLUCOSE: 153 mg/dL — AB (ref 70–99)
Potassium: 4.2 mmol/L (ref 3.5–5.1)
Sodium: 141 mmol/L (ref 135–145)
Total Bilirubin: 0.9 mg/dL (ref 0.3–1.2)
Total Protein: 7.4 g/dL (ref 6.5–8.1)

## 2017-12-19 LAB — DIFFERENTIAL
ABS IMMATURE GRANULOCYTES: 0.1 10*3/uL (ref 0.0–0.1)
BASOS PCT: 1 %
Basophils Absolute: 0.1 10*3/uL (ref 0.0–0.1)
Eosinophils Absolute: 0.1 10*3/uL (ref 0.0–0.7)
Eosinophils Relative: 2 %
Immature Granulocytes: 1 %
Lymphocytes Relative: 24 %
Lymphs Abs: 1.5 10*3/uL (ref 0.7–4.0)
MONOS PCT: 8 %
Monocytes Absolute: 0.5 10*3/uL (ref 0.1–1.0)
NEUTROS PCT: 64 %
Neutro Abs: 4.1 10*3/uL (ref 1.7–7.7)

## 2017-12-19 LAB — I-STAT TROPONIN, ED: TROPONIN I, POC: 0.01 ng/mL (ref 0.00–0.08)

## 2017-12-19 LAB — APTT: aPTT: 28 seconds (ref 24–36)

## 2017-12-19 NOTE — ED Triage Notes (Signed)
Pt reports onset Saturday of vision loss to right eye. Pt went to eye dr and sent here due to possible stroke. Pt denies all other symptoms. Denies any vision change to left eye.

## 2017-12-19 NOTE — Discharge Instructions (Signed)
Have your eye doctor review MRI results.  He can also view the scans on the disc. Return here for any new/acute changes.

## 2017-12-19 NOTE — ED Provider Notes (Signed)
[facility] EMERGENCY DEPARTMENT Provider Note   CSN: [id number] Arrival date & time: 12/19/17  1158     History   Chief Complaint Chief Complaint  Patient presents with  . Loss of Vision    HPI Christophor Wixom is a 67 y.o. male.  Nowell Middlesworth is a 67 y.o. Male with history of diabetes and retinal detachment, who presents to the emergency department for evaluation of right-sided vision loss.  Patient was sent by his ophthalmologist for evaluation of possible stroke.  Patient reports on Saturday he started having central vision loss in the right eye the seem to occur somewhat gradually and symptoms have persisted through today.  Patient presented to his eye doctor's office today, and was told to come to the emergency department with concern for possible stroke.  Patient is a poor historian and has difficulty elaborating on his symptoms per report his vision loss is not complete but feels blurry.  Symptoms are constant and not improving since onset.  Patient did not present with any paperwork or information from Dr. Eliane Decree office.  He denies any pain or redness in the eye.  No associated headaches.  No facial weakness or asymmetry.  No changes in speech or swallowing.  No weakness, numbness or tingling in any of his extremities.  Patient denies any fevers or chills, no chest pain, shortness of breath or abdominal pain.      Past Medical History:  Diagnosis Date  . Retinal detachment    left eye    There are no active problems to display for this patient.   Past Surgical History:  Procedure Laterality Date  . GAS/FLUID EXCHANGE Left 12/16/2016   Procedure: GAS/FLUID EXCHANGE;  Surgeon: Carmela Rima, MD;  Location: [facility] OR;  Service: Ophthalmology;  Laterality: Left;  . LASER PHOTO ABLATION Left 12/16/2016   Procedure: LASER PHOTO ABLATION;  Surgeon: Carmela Rima, MD;  Location: [facility] OR;  Service: Ophthalmology;  Laterality: Left;  . NO PAST SURGERIES    .  REPAIR OF COMPLEX TRACTION RETINAL DETACHMENT Left 12/16/2016   Procedure: REPAIR OF COMPLEX TRACTION RETINAL DETACHMENT LEFT EYE Membranectomy;  Surgeon: Carmela Rima, MD;  Location: [facility] OR;  Service: Ophthalmology;  Laterality: Left;        Home Medications    Prior to Admission medications   Medication Sig Start Date End Date Taking? Authorizing Provider  Multiple Vitamin (MULTIVITAMIN WITH MINERALS) TABS tablet Take 1 tablet by mouth daily.    [provider]    Family History History reviewed. No pertinent family history.  Social History Social History   Tobacco Use  . Smoking status: Never Smoker  . Smokeless tobacco: Never Used  Substance Use Topics  . Alcohol use: No  . Drug use: No     Allergies   Patient has no known allergies.   Review of Systems Review of Systems  Constitutional: Negative for chills and fever.  HENT: Negative.   Eyes: Positive for visual disturbance. Negative for pain, discharge, redness and itching.  Respiratory: Negative for cough and shortness of breath.   Cardiovascular: Negative for chest pain.  Gastrointestinal: Negative for abdominal pain, nausea and vomiting.  Musculoskeletal: Negative for arthralgias, back pain and neck pain.  Skin: Negative for color change and rash.  Neurological: Negative for dizziness, tremors, seizures, syncope, facial asymmetry, speech difficulty, weakness, light-headedness, numbness and headaches.  All other systems reviewed and are negative.    Physical Exam Updated Vital Signs BP (!) 153/88 (BP Location: Right  Arm)   Pulse 82   Temp 98.3 F (36.8 C) (Oral)   Resp 16   SpO2 100%   Physical Exam  Constitutional: He is oriented to person, place, and time. He appears well-developed and well-nourished. No distress.  HENT:  Head: Normocephalic and atraumatic.  Mouth/Throat: Oropharynx is clear and moist.  Eyes: Pupils are equal, round, and reactive to light. Conjunctivae and EOM are  normal. Right eye exhibits no discharge. Left eye exhibits no discharge.  No nystagmus Visual Acuity: R Near: 0/0 (Pt states he cannot see anything with his R eye. States "it's all black" ) L Near: 20/80 Bilateral Near: 20/50  Neck: Normal range of motion. Neck supple.  Cardiovascular: Normal rate, regular rhythm, normal heart sounds and intact distal pulses. Exam reveals no gallop and no friction rub.  No murmur heard. Pulmonary/Chest: Effort normal and breath sounds normal. No stridor. No respiratory distress. He has no wheezes. He has no rales.  Respirations equal and unlabored, patient able to speak in full sentences, lungs clear to auscultation bilaterally  Abdominal: Soft. Bowel sounds are normal. He exhibits no distension and no mass. There is no tenderness. There is no guarding.  Abdomen soft, nondistended, nontender to palpation in all quadrants without guarding or peritoneal signs  Neurological: He is alert and oriented to person, place, and time. Coordination normal.  Speech is clear, able to follow commands Patient has slight droop on the right side of his mouth but no other facial asymmetry or cranial nerve deficits.  CN III-XII intact Normal strength in upper and lower extremities bilaterally including dorsiflexion and plantar flexion, strong and equal grip strength Sensation normal to light and sharp touch Moves extremities without ataxia, coordination intact Normal finger to nose and rapid alternating movements No pronator drift  Skin: He is not diaphoretic.  Psychiatric: He has a normal mood and affect. His behavior is normal.  Nursing note and vitals reviewed.    ED Treatments / Results  Labs (all labs ordered are listed, but only abnormal results are displayed) Labs Reviewed  CBC - Abnormal; Notable for the following components:      Result Value   RBC 4.02 (*)    Hemoglobin 11.6 (*)    HCT 36.0 (*)    All other components within normal limits  COMPREHENSIVE  METABOLIC PANEL - Abnormal; Notable for the following components:   Glucose, Bld 153 (*)    BUN 24 (*)    AST 13 (*)    All other components within normal limits  PROTIME-INR  APTT  DIFFERENTIAL  I-STAT TROPONIN, ED    EKG None  Radiology No results found.  Procedures Procedures (including critical care time)  Medications Ordered in ED Medications - No data to display   Initial Impression / Assessment and Plan / ED Course  I have reviewed the triage vital signs and the nursing notes.  Pertinent labs & imaging results that were available during my care of the patient were reviewed by me and considered in my medical decision making (see chart for details).  Presents from his ophthalmologist office for evaluation of right-sided central visual loss with concern for stroke.  Ophthalmologist did not send patient with any paperwork regarding patient's eye exam today or concern for any other ophthalmic pathology such as central retinal artery occlusion.  Pt denies any other focal neurologic deficits.  On arrival patient is hypertensive but vitals otherwise stable.  Patient has right-sided central visual loss but all peripheral fields are intact,  normal vision on the left.  Patient has a slight asymmetry on the right with smile but no other neurologic deficits are noted on exam.  Labs overall unremarkable, no leukocytosis, stable hemoglobin, no acute electrolyte derangements, normal renal and liver function.  Normal PT/INR, and APPT, negative troponin.  MRI of the brain ordered to assess for stroke.  Patient does not have any headaches or tenderness over the temporal artery, no concern for temporal arteritis.  10:00 PM At shift change care signed out to PA Sharilyn Sites pending MRI.  Will discuss MRI findings with neurology and disposition appropriately if no stroke patient can likely be discharged with follow-up with his ophthalmologist.  Final Clinical Impressions(s) / ED Diagnoses    Final diagnoses:  Visual loss    ED Discharge Orders    None       Dartha Lodge, [place] 12/19/17 2354    Tegeler, Canary Brim, MD 12/20/17 [phone]

## 2017-12-19 NOTE — ED Notes (Signed)
MRI disc received from MRI

## 2017-12-19 NOTE — ED Provider Notes (Signed)
Assumed care from PA Ford at shift change.  See prior notes for full H&P.  Briefly, 67 y.o. M here with partial visual disturbance in right eye since Saturday, 3 days ago.  Described as central vision blurriness but not complete vision loss.  Recent surgery in left eye with Dr. Allena Katz due to diabetic retinopathy.  Send in by ophthalmology for MRI, no notes or information sent with patient to indicate what type of scan.  Plan:  MRI pending.  If no acute findings, may need to talk to neurology.  Results for orders placed or performed during the hospital encounter of 12/19/17  Protime-INR  Result Value Ref Range   Prothrombin Time 12.7 11.4 - 15.2 seconds   INR 0.96   APTT  Result Value Ref Range   aPTT 28 24 - 36 seconds  CBC  Result Value Ref Range   WBC 6.4 4.0 - 10.5 K/uL   RBC 4.02 (L) 4.22 - 5.81 MIL/uL   Hemoglobin 11.6 (L) 13.0 - 17.0 g/dL   HCT 41.3 (L) 24.4 - 01.0 %   MCV 89.6 78.0 - 100.0 fL   MCH 28.9 26.0 - 34.0 pg   MCHC 32.2 30.0 - 36.0 g/dL   RDW 27.2 53.6 - 64.4 %   Platelets 299 150 - 400 K/uL  Differential  Result Value Ref Range   Neutrophils Relative % 64 %   Neutro Abs 4.1 1.7 - 7.7 K/uL   Lymphocytes Relative 24 %   Lymphs Abs 1.5 0.7 - 4.0 K/uL   Monocytes Relative 8 %   Monocytes Absolute 0.5 0.1 - 1.0 K/uL   Eosinophils Relative 2 %   Eosinophils Absolute 0.1 0.0 - 0.7 K/uL   Basophils Relative 1 %   Basophils Absolute 0.1 0.0 - 0.1 K/uL   Immature Granulocytes 1 %   Abs Immature Granulocytes 0.1 0.0 - 0.1 K/uL  Comprehensive metabolic panel  Result Value Ref Range   Sodium 141 135 - 145 mmol/L   Potassium 4.2 3.5 - 5.1 mmol/L   Chloride 106 98 - 111 mmol/L   CO2 26 22 - 32 mmol/L   Glucose, Bld 153 (H) 70 - 99 mg/dL   BUN 24 (H) 8 - 23 mg/dL   Creatinine, Ser 0.34 0.61 - 1.24 mg/dL   Calcium 9.3 8.9 - 74.2 mg/dL   Total Protein 7.4 6.5 - 8.1 g/dL   Albumin 4.0 3.5 - 5.0 g/dL   AST 13 (L) 15 - 41 U/L   ALT 13 0 - 44 U/L   Alkaline Phosphatase  71 38 - 126 U/L   Total Bilirubin 0.9 0.3 - 1.2 mg/dL   GFR calc non Af Amer >60 >60 mL/min   GFR calc Af Amer >60 >60 mL/min   Anion gap 9 5 - 15  I-stat troponin, ED  Result Value Ref Range   Troponin i, poc 0.01 0.00 - 0.08 ng/mL   Comment 3           Mr Brain Wo Contrast  Result Date: 12/19/2017 CLINICAL DATA:  67 y/o M; vision loss to the right eye. Evaluation of possible stroke. EXAM: MRI HEAD WITHOUT CONTRAST TECHNIQUE: Multiplanar, multiecho pulse sequences of the brain and surrounding structures were obtained without intravenous contrast. COMPARISON:  None. FINDINGS: Brain: No acute infarction, hemorrhage, hydrocephalus, extra-axial collection or mass lesion. Patchy nonspecific T2 FLAIR hyperintensities in subcortical and periventricular white matter are compatible with moderate chronic microvascular ischemic changes for age. Moderate volume loss of the brain.  Small chronic lacunar infarcts are present within the bilateral caudate heads in the right posterior putamen. Vascular: Normal flow voids. Skull and upper cervical spine: Normal marrow signal. Sinuses/Orbits: Negative. Other: Bilateral intra-ocular lens replacement. IMPRESSION: 1. No acute intracranial abnormality identified. 2. Moderate chronic microvascular ischemic changes and volume loss of the brain. 3. Small chronic lacunar infarcts within the caudate heads and right posterior putamen. Electronically Signed   By: Mitzi Hansen M.D.   On: 12/19/2017 22:21   Bilateral Near: 20/50-1  R Near: 0/0 (Pt states he cannot see anything with his R eye. States "it's all black" )   L Near: 20/80     MRI without acute findings.  Uncertain as to ophthalmology's concerns as no notes or information were sent with patient.  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  Will discuss with neurology.  Discussed with Dr. Otelia Limes-- does not feel he needs further work-up in ED/inpatient setting.  Recommends referral  back to ophthalmology for further work-up.  Given copies of MRI report, scan also burned on disc for physician review.  Can return here for any new/acute changes.   Garlon Hatchet, PA-C 12/19/17 2359    Tegeler, Canary Brim, MD 12/20/17 [phone]

## 2017-12-19 NOTE — ED Notes (Signed)
Requested disc of MRI.  St's they will bring to [facility] when ready

## 2017-12-21 DIAGNOSIS — H3411 Central retinal artery occlusion, right eye: Secondary | ICD-10-CM | POA: Diagnosis not present

## 2017-12-22 ENCOUNTER — Other Ambulatory Visit: Payer: Self-pay

## 2017-12-22 ENCOUNTER — Emergency Department (HOSPITAL_COMMUNITY)
Admission: EM | Admit: 2017-12-22 | Discharge: 2017-12-22 | Disposition: A | Discharge status: Home / Self Care | Payer: Medicare Other | Attending: Emergency Medicine | Admitting: Emergency Medicine

## 2017-12-22 ENCOUNTER — Emergency Department (HOSPITAL_COMMUNITY): Payer: Medicare Other

## 2017-12-22 ENCOUNTER — Encounter (HOSPITAL_COMMUNITY): Payer: Self-pay

## 2017-12-22 DIAGNOSIS — Z79899 Other long term (current) drug therapy: Secondary | ICD-10-CM | POA: Insufficient documentation

## 2017-12-22 DIAGNOSIS — I6523 Occlusion and stenosis of bilateral carotid arteries: Secondary | ICD-10-CM | POA: Diagnosis not present

## 2017-12-22 DIAGNOSIS — Z7984 Long term (current) use of oral hypoglycemic drugs: Secondary | ICD-10-CM | POA: Diagnosis not present

## 2017-12-22 DIAGNOSIS — H538 Other visual disturbances: Secondary | ICD-10-CM | POA: Insufficient documentation

## 2017-12-22 DIAGNOSIS — H547 Unspecified visual loss: Secondary | ICD-10-CM

## 2017-12-22 DIAGNOSIS — R51 Headache: Secondary | ICD-10-CM | POA: Diagnosis not present

## 2017-12-22 LAB — I-STAT CHEM 8, ED
BUN: 25 mg/dL — ABNORMAL HIGH (ref 8–23)
Calcium, Ion: 1.19 mmol/L (ref 1.15–1.40)
Chloride: 101 mmol/L (ref 98–111)
Creatinine, Ser: 1.1 mg/dL (ref 0.61–1.24)
Glucose, Bld: 133 mg/dL — ABNORMAL HIGH (ref 70–99)
HEMATOCRIT: 35 % — AB (ref 39.0–52.0)
HEMOGLOBIN: 11.9 g/dL — AB (ref 13.0–17.0)
POTASSIUM: 4 mmol/L (ref 3.5–5.1)
Sodium: 139 mmol/L (ref 135–145)
TCO2: 27 mmol/L (ref 22–32)

## 2017-12-22 LAB — SEDIMENTATION RATE: Sed Rate: 13 mm/hr (ref 0–16)

## 2017-12-22 LAB — C-REACTIVE PROTEIN

## 2017-12-22 MED ORDER — ASPIRIN EC 81 MG PO TBEC: 81.0000 mg | DELAYED_RELEASE_TABLET | Freq: Every day | ORAL | 0 refills | Status: AC

## 2017-12-22 MED ORDER — IOPAMIDOL (ISOVUE-370) INJECTION 76%
50.0000 mL | Freq: Once | INTRAVENOUS | Status: AC | PRN
  Administered 2017-12-22: 50 mL via INTRAVENOUS

## 2017-12-22 NOTE — ED Triage Notes (Signed)
Pt endorses loss of vision in right eye since last Saturday that comes and goes. Pt has been having left eye issues as well. Pt went to eye dr and here earlier this week for same, had MRI which was negative for stroke. Went back to eye dr yesterday and he sent the pt for blood work. Pt back this morning for same. VSS.

## 2017-12-22 NOTE — ED Provider Notes (Signed)
[facility] EMERGENCY DEPARTMENT Provider Note   CSN: [id number] Arrival date & time: 12/22/17  0813     History   Chief Complaint Chief Complaint  Patient presents with  . Loss of Vision    HPI Francisco Chang is a 67 y.o. male.  HPI 67 year old male with past medical history of retinal detachment here with vision changes.  The patient has recently been seen twice for similar symptoms.  He has had extensive negative work-up including negative MRI.  He was seen by his ophthalmologist recently and told to present to [facility] for lab work.  However, he presents today for lab work.  Denies any worsening or change in his symptoms.  Denies any floaters.  Denies any focal numbness or weakness.  No visual changes beyond his recent move baseline.  Denies any difficulty speaking or swallowing.  No recent trauma.  Denies any eye pain.  He states that he feels like his vision is blurry and limited in the periphery.  He has a history of retinal detachment secondary to diabetic retinopathy but states his current symptoms are not similar to that.  He was seen by his ophthalmologist for the last 24 hours.  Denies any specific alleviating or aggravating factors.  Past Medical History:  Diagnosis Date  . Retinal detachment    left eye    There are no active problems to display for this patient.   Past Surgical History:  Procedure Laterality Date  . GAS/FLUID EXCHANGE Left 12/16/2016   Procedure: GAS/FLUID EXCHANGE;  Surgeon: Jalene Mullet, MD;  Location: [place];  Service: Ophthalmology;  Laterality: Left;  . LASER PHOTO ABLATION Left 12/16/2016   Procedure: LASER PHOTO ABLATION;  Surgeon: Jalene Mullet, MD;  Location: [place];  Service: Ophthalmology;  Laterality: Left;  . NO PAST SURGERIES    . REPAIR OF COMPLEX TRACTION RETINAL DETACHMENT Left 12/16/2016   Procedure: REPAIR OF COMPLEX TRACTION RETINAL DETACHMENT LEFT EYE Membranectomy;  Surgeon: Jalene Mullet, MD;  Location: [place];  Service: Ophthalmology;  Laterality: Left;        Home Medications    Prior to Admission medications   Medication Sig Start Date End Date Taking? Authorizing Provider  [employer] D3 5000 units capsule Take 5,000 Units by mouth daily. 11/28/17  Yes [provider]  lisinopril-hydrochlorothiazide (PRINZIDE,ZESTORETIC) 20-12.5 MG tablet Take 1 tablet by mouth daily. 11/28/17  Yes [provider]  metFORMIN (GLUCOPHAGE) 850 MG tablet Take 850 mg by mouth 2 (two) times daily with a meal.   Yes [provider]  Multiple Vitamin (MULTIVITAMIN WITH MINERALS) TABS tablet Take 1 tablet by mouth daily.   Yes [provider]  aspirin EC 81 MG tablet Take 1 tablet (81 mg total) by mouth daily. 12/22/17   Duffy Bruce, MD    Family History History reviewed. No pertinent family history.  Social History Social History   Tobacco Use  . Smoking status: Never Smoker  . Smokeless tobacco: Never Used  Substance Use Topics  . Alcohol use: No  . Drug use: No     Allergies   Patient has no known allergies.   Review of Systems Review of Systems  Constitutional: Negative for chills, fatigue and fever.  HENT: Negative for congestion and rhinorrhea.   Eyes: Positive for visual disturbance.  Respiratory: Negative for cough, shortness of breath and wheezing.   Cardiovascular: Negative for chest pain and leg swelling.  Gastrointestinal: Negative for abdominal pain, diarrhea, nausea and vomiting.  Genitourinary: Negative  for dysuria and flank pain.  Musculoskeletal: Negative for neck pain and neck stiffness.  Skin: Negative for rash and wound.  Allergic/Immunologic: Negative for immunocompromised state.  Neurological: Negative for syncope, weakness and headaches.  All other systems reviewed and are negative.    Physical Exam Updated Vital Signs BP (!) 143/72 (BP Location: Right Arm)   Pulse 81   Temp 97.9 F (36.6 C) (Oral)   Resp 17   Ht _0  (1.676 m)    Wt 56.7 kg   SpO2 100%   BMI 20.18 kg/m   Physical Exam  Constitutional: He is oriented to person, place, and time. He appears well-developed and well-nourished. No distress.  HENT:  Head: Normocephalic and atraumatic.  Eyes: Pupils are equal, round, and reactive to light. Conjunctivae, EOM and lids are normal. Right conjunctiva is not injected. Right conjunctiva has no hemorrhage. Left conjunctiva is not injected. Left conjunctiva has no hemorrhage. Pupils are equal.  Neck: Neck supple.  Cardiovascular: Normal rate, regular rhythm and normal heart sounds. Exam reveals no friction rub.  No murmur heard. Pulmonary/Chest: Effort normal and breath sounds normal. No respiratory distress. He has no wheezes. He has no rales.  Abdominal: He exhibits no distension.  Musculoskeletal: He exhibits no edema.  Neurological: He is alert and oriented to person, place, and time. He exhibits normal muscle tone.  Skin: Skin is warm. Capillary refill takes less than 2 seconds.  Psychiatric: He has a normal mood and affect.  Nursing note and vitals reviewed.   Neurological Exam:  Mental Status: Alert and oriented to person, place, and time. Attention and concentration normal. Speech clear. Recent memory is intact. Cranial Nerves: Visual fields grossly intact. EOMI and PERRLA. No nystagmus noted. Facial sensation intact at forehead, maxillary cheek, and chin/mandible bilaterally. No facial asymmetry or weakness. Hearing grossly normal. Uvula is midline, and palate elevates symmetrically. Normal SCM and trapezius strength. Tongue midline without fasciculations. Motor: Muscle strength 5/5 in proximal and distal UE and LE bilaterally. No pronator drift. Muscle tone normal. Reflexes: 2+ and symmetrical in all four extremities.  Sensation: Intact to light touch in upper and lower extremities distally bilaterally.  Gait: Normal without ataxia. Coordination: Normal FTN bilaterally.     ED Treatments /  Results  Labs (all labs ordered are listed, but only abnormal results are displayed) Labs Reviewed  I-STAT CHEM 8, ED - Abnormal; Notable for the following components:      Result Value   BUN 25 (*)    Glucose, Bld 133 (*)    Hemoglobin 11.9 (*)    HCT 35.0 (*)    All other components within normal limits  SEDIMENTATION RATE  C-REACTIVE PROTEIN    EKG None  Radiology Ct Angio Head W Or Wo Contrast  Result Date: 12/22/2017 CLINICAL DATA:  Initial evaluation for chronic headache, visual loss. EXAM: CT ANGIOGRAPHY HEAD AND NECK TECHNIQUE: Multidetector CT imaging of the head and neck was performed using the standard protocol during bolus administration of intravenous contrast. Multiplanar CT image reconstructions and MIPs were obtained to evaluate the vascular anatomy. Carotid stenosis measurements (when applicable) are obtained utilizing NASCET criteria, using the distal internal carotid diameter as the denominator. CONTRAST:  3m ISOVUE-370 IOPAMIDOL (ISOVUE-370) INJECTION 76% COMPARISON:  Prior MRI from 12/19/2017. FINDINGS: CT HEAD FINDINGS Brain: Atrophy with moderate chronic small vessel ischemic disease, stable. No acute intracranial hemorrhage. No acute infarct. No mass lesion, midline shift or mass effect. No hydrocephalus. No extra-axial fluid collection. Vascular: No hyperdense  vessel. Skull: Scalp soft tissues and calvarium within normal limits. Sinuses: Paranasal sinuses and mastoid air cells are clear. Orbits: Globes and orbital soft tissues demonstrate no acute finding. Review of the MIP images confirms the above findings CTA NECK FINDINGS Aortic arch: Visualized aortic arch of normal caliber with normal branch pattern. No significant stenosis about the origin of the great vessels. Visualized subclavian arteries widely patent. Right carotid system: Right common carotid artery widely patent from its origin to the bifurcation. Eccentric calcified plaque at the right bifurcation  without hemodynamically significant stenosis. Right ICA widely patent to the skull base without stenosis, dissection, or occlusion. Left carotid system: Left common carotid artery widely patent from its origin to the bifurcation. Minimal atheromatous irregularity about the left bifurcation without stenosis. Left ICA widely patent to the skull base without stenosis, dissection, or occlusion. Vertebral arteries: Both of the vertebral arteries arise from the subclavian arteries. Left vertebral artery dominant. Focal plaque at the origin of the left vertebral artery without significant stenosis. Short-segment mild tandem stenoses noted at the proximal left V2 segment. Short fenestration noted within the right V2 segment. Vertebral arteries patent within the neck without stenosis, dissection, or occlusion. Skeleton: No acute osseus abnormality. No discrete osseous lesions. C5 and C6 vertebral bodies fused, likely postsurgical. Other neck: No acute soft tissue abnormality within the neck. Upper chest: Visualized upper esophagus mildly patulous. Visualized upper chest and mediastinum demonstrate no acute abnormality. 5 mm subpleural left upper lobe nodule (series 9, image 167), indeterminate. Visualized lungs are otherwise clear. Review of the MIP images confirms the above findings CTA HEAD FINDINGS Anterior circulation: Petrous segments widely patent bilaterally. Mild atheromatous irregularity within the cavernous and supraclinoid ICAs without hemodynamically significant stenosis. A1 segments patent. Normal anterior communicating artery. Anterior cerebral arteries widely patent to their distal aspects without stenosis. M1 segments widely patent bilaterally. Probable short-segment moderate proximal right M2 stenosis noted, inferior division (series 12, image 90). Distal MCA branches well perfused and symmetric. Posterior circulation: Focal non stenotic plaque within the dominant left V4 segment. Diminutive right V4 widely  patent to the vertebrobasilar junction. Posterior inferior cerebral arteries patent bilaterally. Basilar artery widely patent to its distal aspect without stenosis. Superior cerebellar and posterior cerebral arteries widely patent bilaterally. Venous sinuses: Patent. Anatomic variants: None significant. Delayed phase: No abnormal enhancement. Review of the MIP images confirms the above findings IMPRESSION: 1. Negative CTA of the head and neck. No large vessel occlusion. No hemodynamically significant or correctable stenosis. 2. Minor atherosclerotic change involving the major arterial vasculature of the head and neck for patient age as above. Electronically Signed   By: Jeannine Boga M.D.   On: 12/22/2017 20:09   Ct Angio Neck W And/or Wo Contrast  Result Date: 12/22/2017 CLINICAL DATA:  Initial evaluation for chronic headache, visual loss. EXAM: CT ANGIOGRAPHY HEAD AND NECK TECHNIQUE: Multidetector CT imaging of the head and neck was performed using the standard protocol during bolus administration of intravenous contrast. Multiplanar CT image reconstructions and MIPs were obtained to evaluate the vascular anatomy. Carotid stenosis measurements (when applicable) are obtained utilizing NASCET criteria, using the distal internal carotid diameter as the denominator. CONTRAST:  56m ISOVUE-370 IOPAMIDOL (ISOVUE-370) INJECTION 76% COMPARISON:  Prior MRI from 12/19/2017. FINDINGS: CT HEAD FINDINGS Brain: Atrophy with moderate chronic small vessel ischemic disease, stable. No acute intracranial hemorrhage. No acute infarct. No mass lesion, midline shift or mass effect. No hydrocephalus. No extra-axial fluid collection. Vascular: No hyperdense vessel. Skull: Scalp soft tissues and  calvarium within normal limits. Sinuses: Paranasal sinuses and mastoid air cells are clear. Orbits: Globes and orbital soft tissues demonstrate no acute finding. Review of the MIP images confirms the above findings CTA NECK FINDINGS  Aortic arch: Visualized aortic arch of normal caliber with normal branch pattern. No significant stenosis about the origin of the great vessels. Visualized subclavian arteries widely patent. Right carotid system: Right common carotid artery widely patent from its origin to the bifurcation. Eccentric calcified plaque at the right bifurcation without hemodynamically significant stenosis. Right ICA widely patent to the skull base without stenosis, dissection, or occlusion. Left carotid system: Left common carotid artery widely patent from its origin to the bifurcation. Minimal atheromatous irregularity about the left bifurcation without stenosis. Left ICA widely patent to the skull base without stenosis, dissection, or occlusion. Vertebral arteries: Both of the vertebral arteries arise from the subclavian arteries. Left vertebral artery dominant. Focal plaque at the origin of the left vertebral artery without significant stenosis. Short-segment mild tandem stenoses noted at the proximal left V2 segment. Short fenestration noted within the right V2 segment. Vertebral arteries patent within the neck without stenosis, dissection, or occlusion. Skeleton: No acute osseus abnormality. No discrete osseous lesions. C5 and C6 vertebral bodies fused, likely postsurgical. Other neck: No acute soft tissue abnormality within the neck. Upper chest: Visualized upper esophagus mildly patulous. Visualized upper chest and mediastinum demonstrate no acute abnormality. 5 mm subpleural left upper lobe nodule (series 9, image 167), indeterminate. Visualized lungs are otherwise clear. Review of the MIP images confirms the above findings CTA HEAD FINDINGS Anterior circulation: Petrous segments widely patent bilaterally. Mild atheromatous irregularity within the cavernous and supraclinoid ICAs without hemodynamically significant stenosis. A1 segments patent. Normal anterior communicating artery. Anterior cerebral arteries widely patent to  their distal aspects without stenosis. M1 segments widely patent bilaterally. Probable short-segment moderate proximal right M2 stenosis noted, inferior division (series 12, image 90). Distal MCA branches well perfused and symmetric. Posterior circulation: Focal non stenotic plaque within the dominant left V4 segment. Diminutive right V4 widely patent to the vertebrobasilar junction. Posterior inferior cerebral arteries patent bilaterally. Basilar artery widely patent to its distal aspect without stenosis. Superior cerebellar and posterior cerebral arteries widely patent bilaterally. Venous sinuses: Patent. Anatomic variants: None significant. Delayed phase: No abnormal enhancement. Review of the MIP images confirms the above findings IMPRESSION: 1. Negative CTA of the head and neck. No large vessel occlusion. No hemodynamically significant or correctable stenosis. 2. Minor atherosclerotic change involving the major arterial vasculature of the head and neck for patient age as above. Electronically Signed   By: Jeannine Boga M.D.   On: 12/22/2017 20:09    Procedures Procedures (including critical care time)  Medications Ordered in ED Medications  iopamidol (ISOVUE-370) 76 % injection 50 mL (50 mLs Intravenous Contrast Given 12/22/17 1841)     Initial Impression / Assessment and Plan / ED Course  I have reviewed the triage vital signs and the nursing notes.  Pertinent labs & imaging results that were available during my care of the patient were reviewed by me and considered in my medical decision making (see chart for details).     67 yo M here with visual change, previously w/ negative MRI and ophtho eval. Pt presents to ED for lab work ordered by [employer]. On arrival, discussed case with Dr. Posey Pronto, pt's ophthalmologist - he had ordered outpt labs and provided specific instructions, but pt here in ED accidentally. However, pt reportedly having difficulty with TIA/CVA work-up  2/2 difficulty with  PCP and pt has not had ESR/CRP in work-up of GCA. Based on this discussion, labs/imaging ordered. Fortunately, pt has neg ESR/CRP, doubt GCA. CT Angio w/o signs of high-grade occlusion of carotids. Pt does have age-related atherosclerosis. Based on this, will start on low-dose ASA (no known contraindication) and refer for ongoing PCP eval. No new focal neuro deficits. Recent MR neg.  Final Clinical Impressions(s) / ED Diagnoses   Final diagnoses:  Loss of vision    ED Discharge Orders         Ordered    aspirin EC 81 MG tablet  Daily     12/22/17 2032           Duffy Bruce, MD 12/23/17 [phone]

## 2017-12-22 NOTE — ED Notes (Signed)
EDP at bedside  

## 2017-12-22 NOTE — Discharge Instructions (Signed)
For your vision change, call Dr. Allena KatzPatel. Your labs and imaging today were reassuring.  As long as Dr. Allena KatzPatel is okay with it, I'd recommend starting a low-dose, enteric-coated baby aspirin.  Follow-up with your primary care doctor in 1 week.

## 2017-12-22 NOTE — ED Notes (Signed)
Patient able to ambulate independently  

## 2017-12-22 NOTE — ED Notes (Signed)
Pt at CT

## 2017-12-22 NOTE — ED Notes (Signed)
Pt transported to CT ?

## 2017-12-23 DIAGNOSIS — Z9181 History of falling: Secondary | ICD-10-CM | POA: Diagnosis not present

## 2017-12-23 DIAGNOSIS — Z0000 Encounter for general adult medical examination without abnormal findings: Secondary | ICD-10-CM | POA: Diagnosis not present

## 2017-12-23 DIAGNOSIS — Z125 Encounter for screening for malignant neoplasm of prostate: Secondary | ICD-10-CM | POA: Diagnosis not present

## 2017-12-23 DIAGNOSIS — Z1331 Encounter for screening for depression: Secondary | ICD-10-CM | POA: Diagnosis not present

## 2017-12-23 DIAGNOSIS — Z1211 Encounter for screening for malignant neoplasm of colon: Secondary | ICD-10-CM | POA: Diagnosis not present

## 2017-12-23 DIAGNOSIS — Z23 Encounter for immunization: Secondary | ICD-10-CM | POA: Diagnosis not present

## 2018-01-09 DIAGNOSIS — E113511 Type 2 diabetes mellitus with proliferative diabetic retinopathy with macular edema, right eye: Secondary | ICD-10-CM | POA: Diagnosis not present

## 2018-01-23 DIAGNOSIS — E113512 Type 2 diabetes mellitus with proliferative diabetic retinopathy with macular edema, left eye: Secondary | ICD-10-CM | POA: Diagnosis not present

## 2018-02-13 DIAGNOSIS — E113511 Type 2 diabetes mellitus with proliferative diabetic retinopathy with macular edema, right eye: Secondary | ICD-10-CM | POA: Diagnosis not present

## 2018-02-27 DIAGNOSIS — E113512 Type 2 diabetes mellitus with proliferative diabetic retinopathy with macular edema, left eye: Secondary | ICD-10-CM | POA: Diagnosis not present

## 2018-03-06 DIAGNOSIS — G4700 Insomnia, unspecified: Secondary | ICD-10-CM | POA: Diagnosis not present

## 2018-03-06 DIAGNOSIS — E119 Type 2 diabetes mellitus without complications: Secondary | ICD-10-CM | POA: Diagnosis not present

## 2018-03-06 DIAGNOSIS — I10 Essential (primary) hypertension: Secondary | ICD-10-CM | POA: Diagnosis not present

## 2018-03-06 DIAGNOSIS — E559 Vitamin D deficiency, unspecified: Secondary | ICD-10-CM | POA: Diagnosis not present

## 2018-03-06 DIAGNOSIS — Z23 Encounter for immunization: Secondary | ICD-10-CM | POA: Diagnosis not present

## 2018-03-20 DIAGNOSIS — E113511 Type 2 diabetes mellitus with proliferative diabetic retinopathy with macular edema, right eye: Secondary | ICD-10-CM | POA: Diagnosis not present

## 2018-04-03 DIAGNOSIS — E113512 Type 2 diabetes mellitus with proliferative diabetic retinopathy with macular edema, left eye: Secondary | ICD-10-CM | POA: Diagnosis not present

## 2018-04-24 DIAGNOSIS — E113511 Type 2 diabetes mellitus with proliferative diabetic retinopathy with macular edema, right eye: Secondary | ICD-10-CM | POA: Diagnosis not present

## 2018-05-08 DIAGNOSIS — E113522 Type 2 diabetes mellitus with proliferative diabetic retinopathy with traction retinal detachment involving the macula, left eye: Secondary | ICD-10-CM | POA: Diagnosis not present

## 2018-05-29 DIAGNOSIS — E113511 Type 2 diabetes mellitus with proliferative diabetic retinopathy with macular edema, right eye: Secondary | ICD-10-CM | POA: Diagnosis not present

## 2018-06-12 DIAGNOSIS — E113512 Type 2 diabetes mellitus with proliferative diabetic retinopathy with macular edema, left eye: Secondary | ICD-10-CM | POA: Diagnosis not present

## 2018-07-03 DIAGNOSIS — E113511 Type 2 diabetes mellitus with proliferative diabetic retinopathy with macular edema, right eye: Secondary | ICD-10-CM | POA: Diagnosis not present

## 2018-07-17 DIAGNOSIS — E113522 Type 2 diabetes mellitus with proliferative diabetic retinopathy with traction retinal detachment involving the macula, left eye: Secondary | ICD-10-CM | POA: Diagnosis not present

## 2018-08-07 DIAGNOSIS — E113511 Type 2 diabetes mellitus with proliferative diabetic retinopathy with macular edema, right eye: Secondary | ICD-10-CM | POA: Diagnosis not present

## 2018-08-21 DIAGNOSIS — E113512 Type 2 diabetes mellitus with proliferative diabetic retinopathy with macular edema, left eye: Secondary | ICD-10-CM | POA: Diagnosis not present

## 2018-10-02 DIAGNOSIS — E113522 Type 2 diabetes mellitus with proliferative diabetic retinopathy with traction retinal detachment involving the macula, left eye: Secondary | ICD-10-CM | POA: Diagnosis not present

## 2018-10-09 DIAGNOSIS — E113511 Type 2 diabetes mellitus with proliferative diabetic retinopathy with macular edema, right eye: Secondary | ICD-10-CM | POA: Diagnosis not present

## 2018-11-13 DIAGNOSIS — E113512 Type 2 diabetes mellitus with proliferative diabetic retinopathy with macular edema, left eye: Secondary | ICD-10-CM | POA: Diagnosis not present

## 2018-12-25 DIAGNOSIS — E113512 Type 2 diabetes mellitus with proliferative diabetic retinopathy with macular edema, left eye: Secondary | ICD-10-CM | POA: Diagnosis not present

## 2019-01-01 DIAGNOSIS — E113511 Type 2 diabetes mellitus with proliferative diabetic retinopathy with macular edema, right eye: Secondary | ICD-10-CM | POA: Diagnosis not present

## 2019-01-09 DIAGNOSIS — Z9181 History of falling: Secondary | ICD-10-CM | POA: Diagnosis not present

## 2019-01-09 DIAGNOSIS — Z139 Encounter for screening, unspecified: Secondary | ICD-10-CM | POA: Diagnosis not present

## 2019-01-09 DIAGNOSIS — Z0000 Encounter for general adult medical examination without abnormal findings: Secondary | ICD-10-CM | POA: Diagnosis not present

## 2019-01-09 DIAGNOSIS — Z1331 Encounter for screening for depression: Secondary | ICD-10-CM | POA: Diagnosis not present

## 2019-01-09 DIAGNOSIS — Z125 Encounter for screening for malignant neoplasm of prostate: Secondary | ICD-10-CM | POA: Diagnosis not present

## 2019-01-15 DIAGNOSIS — E119 Type 2 diabetes mellitus without complications: Secondary | ICD-10-CM | POA: Diagnosis not present

## 2019-01-15 DIAGNOSIS — I10 Essential (primary) hypertension: Secondary | ICD-10-CM | POA: Diagnosis not present

## 2019-01-15 DIAGNOSIS — G4700 Insomnia, unspecified: Secondary | ICD-10-CM | POA: Diagnosis not present

## 2019-01-15 DIAGNOSIS — E559 Vitamin D deficiency, unspecified: Secondary | ICD-10-CM | POA: Diagnosis not present

## 2019-01-22 DIAGNOSIS — H35373 Puckering of macula, bilateral: Secondary | ICD-10-CM | POA: Diagnosis not present

## 2019-01-22 DIAGNOSIS — E113513 Type 2 diabetes mellitus with proliferative diabetic retinopathy with macular edema, bilateral: Secondary | ICD-10-CM | POA: Diagnosis not present

## 2019-01-22 DIAGNOSIS — Z961 Presence of intraocular lens: Secondary | ICD-10-CM | POA: Diagnosis not present

## 2019-01-22 DIAGNOSIS — H3582 Retinal ischemia: Secondary | ICD-10-CM | POA: Diagnosis not present

## 2019-01-29 DIAGNOSIS — E113512 Type 2 diabetes mellitus with proliferative diabetic retinopathy with macular edema, left eye: Secondary | ICD-10-CM | POA: Diagnosis not present

## 2019-02-05 DIAGNOSIS — E113512 Type 2 diabetes mellitus with proliferative diabetic retinopathy with macular edema, left eye: Secondary | ICD-10-CM | POA: Diagnosis not present

## 2019-03-05 DIAGNOSIS — E113512 Type 2 diabetes mellitus with proliferative diabetic retinopathy with macular edema, left eye: Secondary | ICD-10-CM | POA: Diagnosis not present

## 2019-03-19 DIAGNOSIS — E113511 Type 2 diabetes mellitus with proliferative diabetic retinopathy with macular edema, right eye: Secondary | ICD-10-CM | POA: Diagnosis not present

## 2019-10-11 DEATH — deceased

## 2019-10-18 ENCOUNTER — Other Ambulatory Visit: Payer: Self-pay | Admitting: *Deleted

## 2019-10-18 NOTE — Patient Outreach (Signed)
  [facility]) Care Management Chronic Special Needs Program    10/18/2019  Name: Francisco Chang, DOB: July 03, 1950  MRN: [id number]   Mr. Francisco Chang is enrolled in a chronic special needs plan for Diabetes.  Client dis-enrolled from coverage on 10/10/19.  Case closed.  No primary care provider listed.  Irving Shows [facility], BSN [facility] RN Care Coordinator, CSNP [phone]

## 2019-11-08 ENCOUNTER — Ambulatory Visit: Payer: Self-pay | Admitting: *Deleted

## 2020-08-14 IMAGING — MR MR HEAD W/O CM
10 of 11 series · 42 of 48 positions shown · non-contrast
Comparison: None.

CLINICAL DATA: 67 y/o M; vision loss to the right eye. Evaluation
of possible stroke.

EXAM:
MRI HEAD WITHOUT CONTRAST
TECHNIQUE: Multiplanar, multiecho pulse sequences of the brain and surrounding
structures were obtained without intravenous contrast.

[Series 5: ax dwi_tracew · axial · 3.0mm · 1.50mm/px · z∈[-42,+89]mm · 9 of 90 slices shown]
[im 1/90]
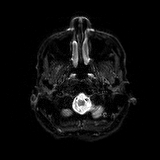
[im 12/90]
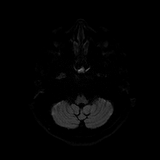
[im 23/90]
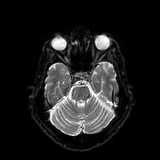
[im 34/90]
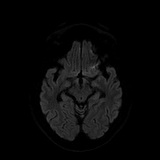
[im 45/90]
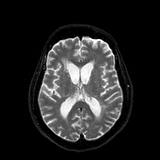
[im 56/90]
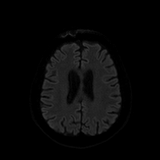
[im 67/90]
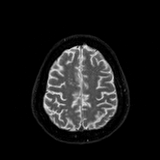
[im 78/90]
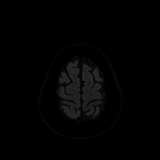
[im 90/90]
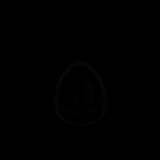

[Series 6: ax dwi_adc · axial · 3.0mm · 1.50mm/px · z∈[-42,+89]mm · 4 of 45 slices shown]
[im 1/45]
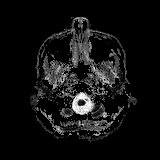
[im 15/45]
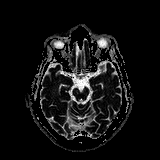
[im 30/45]
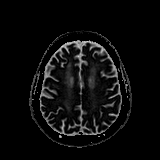
[im 45/45]
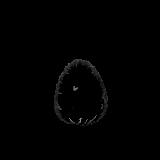

[Series 7: T1 · sagittal · 5.0mm · 0.75mm/px · 2 of 23 slices shown]
[im 1/23]
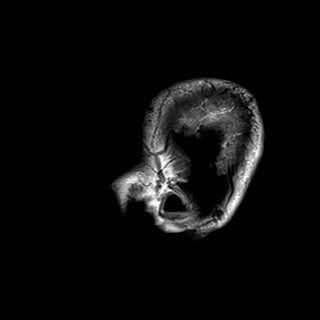
[im 23/23]
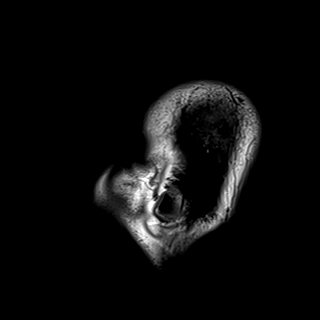

[Series 8: cor dwi_tracew · coronal · 5.0mm · 1.44mm/px · 6 of 64 slices shown]
[im 1/64]
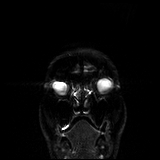
[im 13/64]
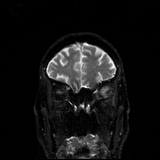
[im 26/64]
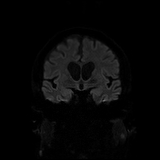
[im 38/64]
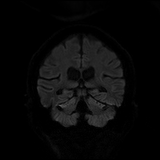
[im 51/64]
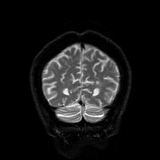
[im 64/64]
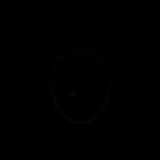

[Series 9: cor dwi_adc · coronal · 5.0mm · 1.44mm/px · 3 of 32 slices shown]
[im 1/32]
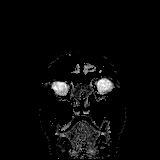
[im 16/32]
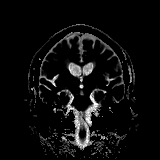
[im 32/32]
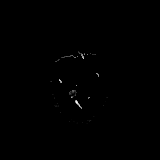

[Series 10: T2 · axial · 5.0mm · 0.72mm/px · z∈[-47,+95]mm · 2 of 25 slices shown (1 of 2)]
[im 1/25]
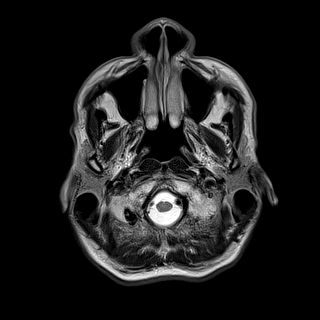
[im 25/25]
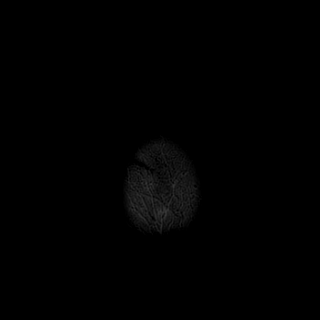

[Series 11: FLAIR · axial · 5.0mm · 0.45mm/px · z∈[-46,+97]mm · 2 of 25 slices shown]
[im 1/25]
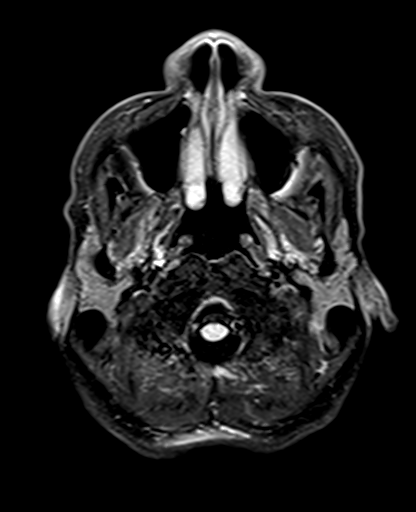
[im 25/25]
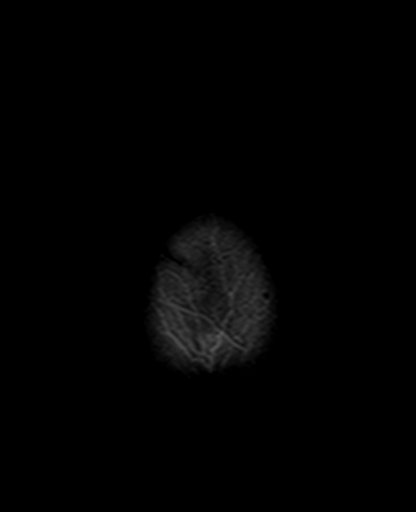

[Series 12: swi_images · axial · 3.0mm · 0.90mm/px · z∈[-61,+114]mm · 6 of 60 slices shown]
[im 1/60]
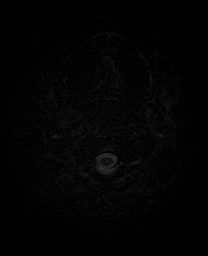
[im 12/60]
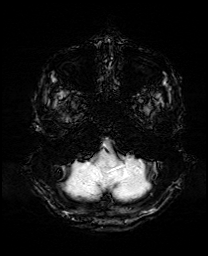
[im 24/60]
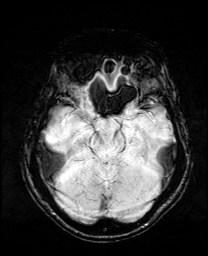
[im 36/60]
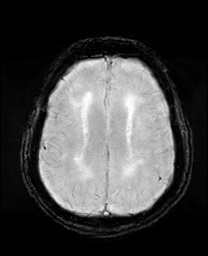
[im 48/60]
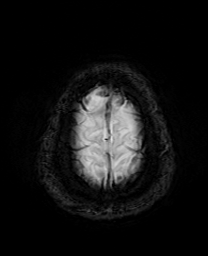
[im 60/60]
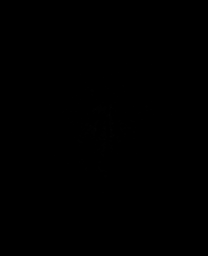

[Series 13: mip_images(sw) · axial · 24.0mm · 0.90mm/px · z∈[-51,+104]mm · 5 of 53 slices shown]
[im 1/53]
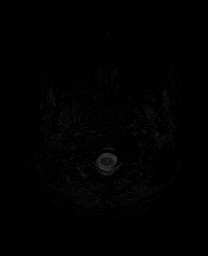
[im 14/53]
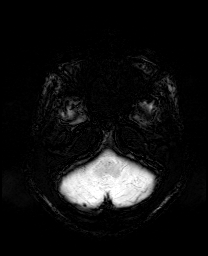
[im 27/53]
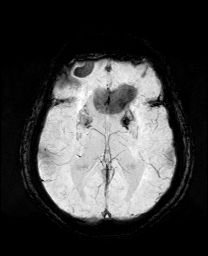
[im 40/53]
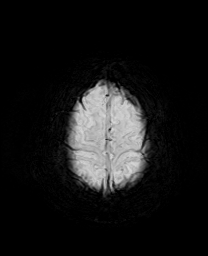
[im 53/53]
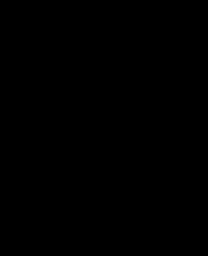

[Series 15: T2 · coronal · 5.0mm · 0.34mm/px · 3 of 29 slices shown (2 of 2)]
[im 1/29]
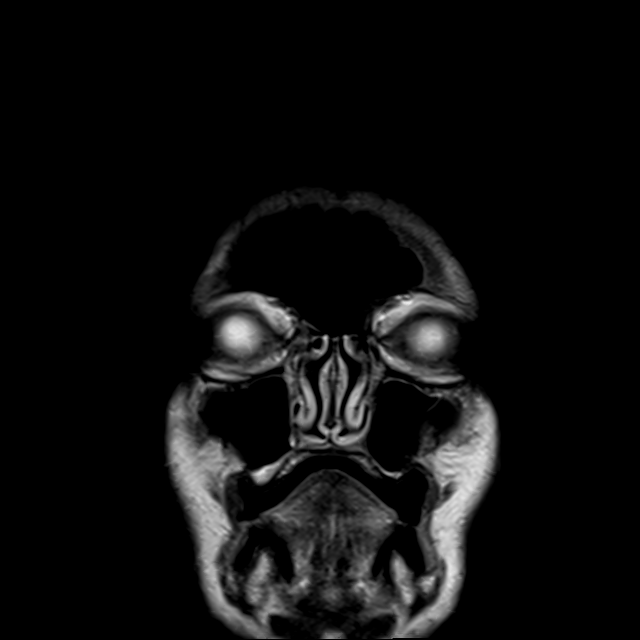
[im 15/29]
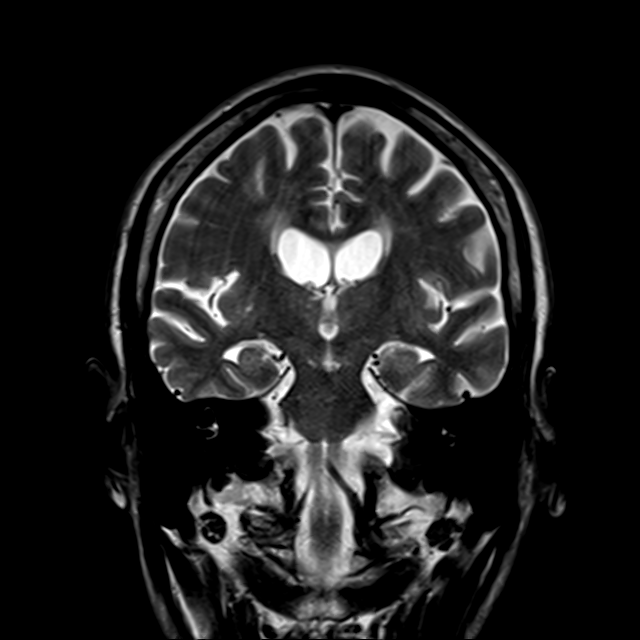
[im 29/29]
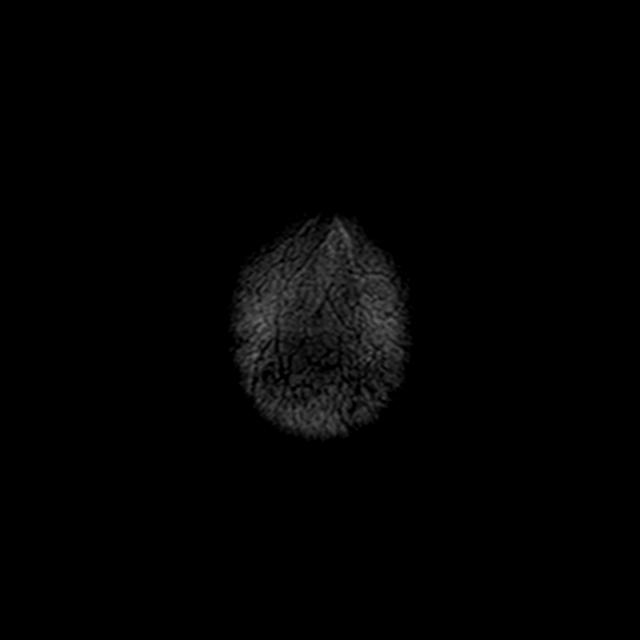

[42 of 48 positions shown; findings below may reference images not displayed]

FINDINGS: Brain: No acute infarction, hemorrhage, hydrocephalus, extra-axial
collection or mass lesion. Patchy nonspecific T2 FLAIR
hyperintensities in subcortical and periventricular white matter are
compatible with moderate chronic microvascular ischemic changes for
age. Moderate volume loss of the brain. Small chronic lacunar
infarcts are present within the bilateral caudate heads in the right
posterior putamen.

Vascular: Normal flow voids.

Skull and upper cervical spine: Normal marrow signal.

Sinuses/Orbits: Negative.

Other: Bilateral intra-ocular lens replacement.
IMPRESSION: 1. No acute intracranial abnormality identified.
2. Moderate chronic microvascular ischemic changes and volume loss
of the brain.
3. Small chronic lacunar infarcts within the caudate heads and right
posterior putamen.

By: Alia Tiger M.D.
